# Patient Record
Sex: Male | Born: 1995 | Race: White | Hispanic: No | Marital: Single | Smoking: Never smoker
Health system: Southern US, Community
[De-identification: ages and names within clinical notes are randomized; demographics above are authoritative.]

## PROBLEM LIST (undated history)

## (undated) DIAGNOSIS — R1310 Dysphagia, unspecified: Secondary | ICD-10-CM

## (undated) DIAGNOSIS — R198 Other specified symptoms and signs involving the digestive system and abdomen: Secondary | ICD-10-CM

## (undated) DIAGNOSIS — Z8709 Personal history of other diseases of the respiratory system: Secondary | ICD-10-CM

## (undated) DIAGNOSIS — S62609A Fracture of unspecified phalanx of unspecified finger, initial encounter for closed fracture: Secondary | ICD-10-CM

## (undated) DIAGNOSIS — J45909 Unspecified asthma, uncomplicated: Secondary | ICD-10-CM

## (undated) DIAGNOSIS — Z8739 Personal history of other diseases of the musculoskeletal system and connective tissue: Secondary | ICD-10-CM

## (undated) HISTORY — PX: MANDIBLE FRACTURE SURGERY: SHX706

## (undated) HISTORY — PX: MEATOTOMY: SUR859

## (undated) HISTORY — PX: CYSTOSCOPY: SUR368

---
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
## 2015-02-01 ENCOUNTER — Emergency Department (HOSPITAL_COMMUNITY)
Admission: EM | Admit: 2015-02-01 | Discharge: 2015-02-02 | Disposition: A | Discharge status: Home / Self Care | Payer: BLUE CROSS/BLUE SHIELD | Attending: Emergency Medicine | Admitting: Emergency Medicine

## 2015-02-01 ENCOUNTER — Emergency Department (HOSPITAL_COMMUNITY): Payer: BLUE CROSS/BLUE SHIELD

## 2015-02-01 DIAGNOSIS — Z8739 Personal history of other diseases of the musculoskeletal system and connective tissue: Secondary | ICD-10-CM | POA: Diagnosis not present

## 2015-02-01 DIAGNOSIS — S6992XA Unspecified injury of left wrist, hand and finger(s), initial encounter: Secondary | ICD-10-CM | POA: Diagnosis present

## 2015-02-01 DIAGNOSIS — Y92310 Basketball court as the place of occurrence of the external cause: Secondary | ICD-10-CM | POA: Diagnosis not present

## 2015-02-01 DIAGNOSIS — Z791 Long term (current) use of non-steroidal anti-inflammatories (NSAID): Secondary | ICD-10-CM | POA: Insufficient documentation

## 2015-02-01 DIAGNOSIS — J45909 Unspecified asthma, uncomplicated: Secondary | ICD-10-CM | POA: Diagnosis not present

## 2015-02-01 DIAGNOSIS — S62623A Displaced fracture of medial phalanx of left middle finger, initial encounter for closed fracture: Secondary | ICD-10-CM | POA: Diagnosis not present

## 2015-02-01 DIAGNOSIS — Z79899 Other long term (current) drug therapy: Secondary | ICD-10-CM | POA: Insufficient documentation

## 2015-02-01 DIAGNOSIS — W500XXA Accidental hit or strike by another person, initial encounter: Secondary | ICD-10-CM | POA: Diagnosis not present

## 2015-02-01 DIAGNOSIS — Y998 Other external cause status: Secondary | ICD-10-CM | POA: Diagnosis not present

## 2015-02-01 DIAGNOSIS — S62609A Fracture of unspecified phalanx of unspecified finger, initial encounter for closed fracture: Secondary | ICD-10-CM

## 2015-02-01 DIAGNOSIS — Y9367 Activity, basketball: Secondary | ICD-10-CM | POA: Diagnosis not present

## 2015-02-01 DIAGNOSIS — S60032A Contusion of left middle finger without damage to nail, initial encounter: Secondary | ICD-10-CM | POA: Insufficient documentation

## 2015-02-01 NOTE — ED Provider Notes (Signed)
CSN: [id number]     Arrival date & time 02/01/15  2237 History   First MD Initiated Contact with Patient 02/01/15 2246     Chief Complaint  Patient presents with  . Finger Injury     (Consider location/radiation/quality/duration/timing/severity/associated sxs/prior Treatment) HPI   Howard Martin is a 19 y.o. male who presents to the Emergency Department complaining of left middle finger pain since yesterday.  He states that he was playing basketball when he accidentally struck his finger on another player.  Patient states he pulled his finger and splinted his finger but continued to have pain to his finger today.  He state that he played basketball again today and noticed increased bruising and swelling to the finger tonight.  Pain increased with movement of the finger.  He took tylenol this morning without relief.  He denies numbness, open wounds, wrist pain or pain to the other fingers.     Past Medical History  Diagnosis Date  . Bronchial asthma   . Scoliosis    Past Surgical History  Procedure Laterality Date  . Meatotomy    . Cystoscopy    . Mouth surgery    . Spinal fusion     Family History  Problem Relation Age of Onset  . Arthritis    . Cancer    . Asthma     Social History  Substance Use Topics  . Smoking status: Never Smoker   . Smokeless tobacco: Never Used  . Alcohol Use: No    Review of Systems  Constitutional: Negative for fever and chills.  Genitourinary: Negative for dysuria and difficulty urinating.  Musculoskeletal: Positive for joint swelling and arthralgias.  Skin: Negative for color change and wound.  All other systems reviewed and are negative.     Allergies  Cefprozil  Home Medications   Prior to Admission medications   Medication Sig Start Date End Date Taking? Authorizing Provider  albuterol (PROVENTIL HFA;VENTOLIN HFA) 108 (90 BASE) MCG/ACT inhaler Inhale 2 puffs into the lungs every 4 (four) hours as needed. For shortness of  breath     Historical Provider, MD  ibuprofen (ADVIL,MOTRIN) 800 MG tablet Take 1 tablet (800 mg total) by mouth 3 (three) times daily. 12/29/14   Eber Hong, MD   BP 117/74 mmHg  Pulse 102  Temp(Src) 98 F (36.7 C) (Oral)  Resp 16  Ht  (1.778 m)  Wt 70.308 kg  BMI 22.24 kg/m2  SpO2 99% Physical Exam  Constitutional: He is oriented to person, place, and time. He appears well-developed and well-nourished. No distress.  HENT:  Head: Normocephalic and atraumatic.  Cardiovascular: Normal rate, regular rhythm and normal heart sounds.   Pulmonary/Chest: Effort normal and breath sounds normal.  Musculoskeletal: He exhibits tenderness. He exhibits no edema.  ttp of the PIP and DIP joints of the left middle finger, mild to moderate edema and ecchymosis of the DIP, radial pulse is brisk, distal sensation intact.  CR< 2 sec.  No open wound or bony deformity.    Neurological: He is alert and oriented to person, place, and time. He exhibits normal muscle tone. Coordination normal.  Skin: Skin is warm and dry.  Nursing note and vitals reviewed.   ED Course  Procedures (including critical care time) Labs Review Labs Reviewed - No data to display  Imaging Review Dg Finger Middle Left  02/01/2015  CLINICAL DATA:  Left middle finger injury playing basketball yesterday. Pain. EXAM: LEFT MIDDLE FINGER 2+V COMPARISON:  None. FINDINGS:  Fracture subluxation of the middle phalanx with involvement of the articular surface at the proximal interphalangeal joint. Volar plate fragment measures 4 mm with dorsal subluxation of the remainder of the middle phalanx. Associated soft tissue edema. IMPRESSION: Volar plate fracture of middle phalanx with dorsal subluxation of the digit at the proximal interphalangeal joint. Electronically Signed   By: Rubye OaksMelanie  Ehinger M.D.   On: 02/01/2015 23:35   I have personally reviewed and evaluated these images and lab results as part of my medical  decision-making.    MDM   Final diagnoses:  Finger fracture, closed, initial encounter    XR results reviewed and discussed with patient.    Finger splinted by nursing staff, remains NV intact.  He agrees to close orthopedic f/u, RICE therapy.  Ibuprofen for pain.      Pauline Ausammy Courtlynn Holloman, PA-C 02/02/15 0000  Samuel JesterKathleen McManus, DO 02/03/15 0000

## 2015-02-01 NOTE — ED Notes (Signed)
I injured my left middle finger when I was playing basketball yesterday. Patient had a splint in place when he arrived.

## 2015-02-01 NOTE — Discharge Instructions (Signed)
Finger Fracture °Finger fractures are breaks in the bones of the fingers. There are many types of fractures. There are also different ways of treating these fractures. Your doctor will talk with you about the best way to treat your fracture. °Injury is the main cause of broken fingers. This includes: °· Injuries while playing sports. °· Workplace injuries. °· Falls. °HOME CARE °· Follow your doctor's instructions for: °¨ Activities. °¨ Exercises. °¨ Physical therapy. °· Take medicines only as told by your doctor for pain, discomfort, or fever. °GET HELP IF: °You have pain or swelling that limits: °· The motion of your fingers. °· The use of your fingers. °GET HELP RIGHT AWAY IF: °· You cannot feel your fingers, or your fingers become numb. °  °This information is not intended to replace advice given to you by your health care provider. Make sure you discuss any questions you have with your health care provider. °  °Document Released: 07/21/2007 Document Revised: 02/22/2014 Document Reviewed: 09/13/2012 °Elsevier Interactive Patient Education ©2016 [employer]. ° °

## 2015-02-02 NOTE — ED Notes (Signed)
Pt states understanding of care given and follow up instructions 

## 2015-02-11 ENCOUNTER — Ambulatory Visit: Payer: Self-pay | Admitting: Orthopedic Surgery

## 2015-02-11 ENCOUNTER — Telehealth: Payer: Self-pay | Admitting: Orthopedic Surgery

## 2015-02-11 NOTE — Telephone Encounter (Signed)
Call (voice message) received from patient/mom, following [facility] visit for problem of fractured finger, left hand, date of injury 02/01/15.  Returned call on our first day of return to clinic; reached voice mail; left message (ph [phone]) offering appointment.

## 2015-02-11 NOTE — Telephone Encounter (Signed)
Patient is scheduled to see Dr. Romeo AppleHarrison, Patient's mother is aware.

## 2015-02-13 ENCOUNTER — Ambulatory Visit (INDEPENDENT_AMBULATORY_CARE_PROVIDER_SITE_OTHER): Payer: BLUE CROSS/BLUE SHIELD | Admitting: Orthopedic Surgery

## 2015-02-13 VITALS — BP 120/78 | Ht 70.0 in | Wt 155.0 lb

## 2015-02-13 DIAGNOSIS — S62609A Fracture of unspecified phalanx of unspecified finger, initial encounter for closed fracture: Secondary | ICD-10-CM | POA: Diagnosis not present

## 2015-02-13 NOTE — Progress Notes (Signed)
New ER referral  Chief Complaint  Patient presents with  . Hand Injury    er follow up left long finger fracture, DOI 02/01/15    HPI Comments: Injured playing basket ball 12-16,  He felt a pop out of place it popped back in place. He then played basketball the next day and it did 2 more times and each time he reduced it. He went to the emergency room x-rays show fracture dislocation PIP joint  His complaints are PIP joint pain 12-13 days moderate in severity associated with loss of motion in the context of trauma pain not relieved by splinting.  Hand Injury     Review of Systems  Constitutional: Negative.   Neurological: Negative.    Past Medical History  Diagnosis Date  . Bronchial asthma   . Scoliosis    BP 120/78 mmHg  Ht 5\' 10"  (1.778 m)  Wt 155 lb (70.308 kg)  BMI 22.24 kg/m2 aaox 3  Mood normal  Normal appearance grooming hygiene. Gait normal. Skin normal. Sensation normal. Color of the digit normal.  Swollen PIP joint no flexion tender over the volar aspect stability tests without anesthesia and conclusive condition of flexor tendon motor function unreliable  All of his digits at the PIP joint level on both hands hyperextended  I reviewed his x-ray my independent interpretation is that he has a fracture dislocation at the PIP joint involving 50% of the joint surface there is some comminution there is well  Recommend follow-up with hand specialist  Encounter Diagnosis  Name Primary?  . Fracture dislocation of finger, closed, initial encounter Yes

## 2015-02-13 NOTE — Patient Instructions (Signed)
REFERRAL TO DR [facility] TUES JAN 3 AT 2 PM

## 2015-02-16 DIAGNOSIS — S62609A Fracture of unspecified phalanx of unspecified finger, initial encounter for closed fracture: Secondary | ICD-10-CM

## 2015-02-16 HISTORY — DX: Fracture of unspecified phalanx of unspecified finger, initial encounter for closed fracture: S62.609A

## 2015-02-19 ENCOUNTER — Other Ambulatory Visit: Payer: Self-pay | Admitting: Orthopedic Surgery

## 2015-02-20 ENCOUNTER — Encounter (HOSPITAL_BASED_OUTPATIENT_CLINIC_OR_DEPARTMENT_OTHER): Payer: Self-pay | Admitting: *Deleted

## 2015-02-20 NOTE — H&P (Signed)
Howard Martin is an 20 y.o. male.   Chief Complaint: h/o injury to L LF on 12-16. HPI: Was evaluated for first time in my office on 02-19-15, with persistent L LF PIP dorsal fx-dx, pain , stiffness, swelling.  Past Medical History  Diagnosis Date  . Difficulty swallowing pills   . History of kyphosis   . History of asthma     no inhaler use in years, per pt.  . Fracture dislocation of finger 02/2015    left long    Past Surgical History  Procedure Laterality Date  . Meatotomy    . Cystoscopy    . Spinal fusion  12/2012    thoracic/lumbar  . Tooth extraction  07/09/2003    removal impacted mesiodens  . Mandible fracture surgery      History reviewed. No pertinent family history. Social History:  reports that he has never smoked. He has never used smokeless tobacco. He reports that he does not drink alcohol or use illicit drugs.  Allergies:  Allergies  Allergen Reactions  . Cefprozil Other (See Comments)    UNKNOWN    No prescriptions prior to admission    No results found for this or any previous visit (from the past 48 hour(s)). No results found.  Review of Systems  All other systems reviewed and are negative.   Height 5\' 9"  (1.753 m), weight 73.483 kg (162 lb). Physical Exam  Constitutional: He is oriented to person, place, and time. He appears well-developed and well-nourished.  HENT:  Head: Normocephalic and atraumatic.  Eyes: EOM are normal.  Neck: Normal range of motion. Neck supple.  Cardiovascular: Intact distal pulses.   Respiratory: Effort normal.  GI: Soft.  Musculoskeletal:  L LF swollen, TTP at PIP.  Obvious dorsal dx at PIP.  No angular deformity.  Neurological: He is alert and oriented to person, place, and time. He has normal reflexes.  Skin: Skin is warm and dry.  Psychiatric: He has a normal mood and affect. His behavior is normal. Judgment and thought content normal.     Assessment/Plan 623-week old L LF PIP dorsal fx-dx  To OR for open  treament, likely with placement of a special force-couple splint as well.  G/R/O reviewed and consent obtained.  Tymeer Vaquera A. 02/20/2015, 1:37 PM

## 2015-02-21 ENCOUNTER — Ambulatory Visit (HOSPITAL_COMMUNITY): Payer: BLUE CROSS/BLUE SHIELD | Admitting: Anesthesiology

## 2015-02-21 ENCOUNTER — Encounter (HOSPITAL_BASED_OUTPATIENT_CLINIC_OR_DEPARTMENT_OTHER): Payer: Self-pay | Admitting: Anesthesiology

## 2015-02-21 ENCOUNTER — Ambulatory Visit (HOSPITAL_BASED_OUTPATIENT_CLINIC_OR_DEPARTMENT_OTHER)
Admission: RE | Admit: 2015-02-21 | Discharge: 2015-02-21 | Disposition: A | Discharge status: Home / Self Care | Payer: BLUE CROSS/BLUE SHIELD | Source: Ambulatory Visit | Attending: Orthopedic Surgery | Admitting: Orthopedic Surgery

## 2015-02-21 HISTORY — DX: Dysphagia, unspecified: R13.10

## 2015-02-21 HISTORY — DX: Personal history of other diseases of the respiratory system: Z87.09

## 2015-02-21 HISTORY — DX: Personal history of other diseases of the musculoskeletal system and connective tissue: Z87.39

## 2015-02-21 HISTORY — DX: Other specified symptoms and signs involving the digestive system and abdomen: R19.8

## 2015-02-21 HISTORY — DX: Fracture of unspecified phalanx of unspecified finger, initial encounter for closed fracture: S62.609A

## 2015-02-21 MED ORDER — PROPOFOL 10 MG/ML IV BOLUS
INTRAVENOUS | Status: AC
  Filled 2015-02-21: qty 20

## 2015-02-24 SURGERY — OPEN REDUCTION INTERNAL FIXATION (ORIF) DISTAL PHALANX
Anesthesia: General | Site: Finger | Laterality: Left

## 2015-02-24 MED ORDER — LACTATED RINGERS IV SOLN: INTRAVENOUS | Status: DC

## 2015-02-24 MED ORDER — MIDAZOLAM HCL 2 MG/2ML IJ SOLN: 1.0000 mg | INTRAMUSCULAR | Status: DC | PRN

## 2015-02-24 MED ORDER — FENTANYL CITRATE (PF) 100 MCG/2ML IJ SOLN: 50.0000 ug | INTRAMUSCULAR | Status: DC | PRN

## 2015-02-24 MED ORDER — SCOPOLAMINE 1 MG/3DAYS TD PT72: 1.0000 | MEDICATED_PATCH | Freq: Once | TRANSDERMAL | Status: DC | PRN

## 2015-02-24 MED ORDER — GLYCOPYRROLATE 0.2 MG/ML IJ SOLN: 0.2000 mg | Freq: Once | INTRAMUSCULAR | Status: DC | PRN

## 2015-02-24 NOTE — Progress Notes (Signed)
[facility]  Patient denies any change in medications or medical condition since that time.  I [facility] and go to Admitting to register.  Patient reported that his mother or step father will be driving him home and that he will have someone with him for the first 24 hours.

## 2015-02-25 ENCOUNTER — Encounter (HOSPITAL_BASED_OUTPATIENT_CLINIC_OR_DEPARTMENT_OTHER): Payer: Self-pay | Admitting: Anesthesiology

## 2015-02-25 ENCOUNTER — Encounter (HOSPITAL_COMMUNITY)
Admission: RE | Disposition: A | Discharge status: Home / Self Care | Payer: Self-pay | Source: Ambulatory Visit | Attending: Orthopedic Surgery

## 2015-02-25 ENCOUNTER — Ambulatory Visit (HOSPITAL_BASED_OUTPATIENT_CLINIC_OR_DEPARTMENT_OTHER): Payer: BLUE CROSS/BLUE SHIELD | Admitting: Anesthesiology

## 2015-02-25 ENCOUNTER — Ambulatory Visit (HOSPITAL_COMMUNITY): Payer: BLUE CROSS/BLUE SHIELD

## 2015-02-25 ENCOUNTER — Ambulatory Visit (HOSPITAL_BASED_OUTPATIENT_CLINIC_OR_DEPARTMENT_OTHER)
Admission: RE | Admit: 2015-02-25 | Discharge: 2015-02-25 | Disposition: A | Discharge status: Home / Self Care | Payer: BLUE CROSS/BLUE SHIELD | Source: Ambulatory Visit | Attending: Orthopedic Surgery | Admitting: Orthopedic Surgery

## 2015-02-25 ENCOUNTER — Ambulatory Visit (HOSPITAL_COMMUNITY)
Admission: RE | Admit: 2015-02-25 | Discharge: 2015-02-25 | Disposition: A | Discharge status: Home / Self Care | Payer: BLUE CROSS/BLUE SHIELD | Source: Ambulatory Visit | Attending: Orthopedic Surgery | Admitting: Orthopedic Surgery

## 2015-02-25 ENCOUNTER — Encounter (HOSPITAL_COMMUNITY): Payer: Self-pay | Admitting: *Deleted

## 2015-02-25 ENCOUNTER — Encounter (HOSPITAL_BASED_OUTPATIENT_CLINIC_OR_DEPARTMENT_OTHER)
Admission: RE | Disposition: A | Discharge status: Home / Self Care | Payer: Self-pay | Source: Ambulatory Visit | Attending: Orthopedic Surgery

## 2015-02-25 DIAGNOSIS — S62613A Displaced fracture of proximal phalanx of left middle finger, initial encounter for closed fracture: Secondary | ICD-10-CM | POA: Diagnosis present

## 2015-02-25 DIAGNOSIS — X58XXXA Exposure to other specified factors, initial encounter: Secondary | ICD-10-CM | POA: Insufficient documentation

## 2015-02-25 DIAGNOSIS — T148XXA Other injury of unspecified body region, initial encounter: Secondary | ICD-10-CM

## 2015-02-25 HISTORY — PX: OPEN REDUCTION INTERNAL FIXATION (ORIF) PROXIMAL PHALANX: SHX6235

## 2015-02-25 LAB — CBC
HEMATOCRIT: 42.9 % (ref 39.0–52.0)
Hemoglobin: 15.6 g/dL (ref 13.0–17.0)
MCH: 31.3 pg (ref 26.0–34.0)
MCHC: 36.4 g/dL — AB (ref 30.0–36.0)
MCV: 86.1 fL (ref 78.0–100.0)
Platelets: 205 10*3/uL (ref 150–400)
RBC: 4.98 MIL/uL (ref 4.22–5.81)
RDW: 12.3 % (ref 11.5–15.5)
WBC: 4 10*3/uL (ref 4.0–10.5)

## 2015-02-25 SURGERY — OPEN REDUCTION INTERNAL FIXATION (ORIF) PROXIMAL PHALANX
Anesthesia: General | Site: Finger | Laterality: Left

## 2015-02-25 SURGERY — OPEN REDUCTION INTERNAL FIXATION (ORIF) HAND
Anesthesia: General

## 2015-02-25 MED ORDER — HYDROMORPHONE HCL 1 MG/ML IJ SOLN
0.2500 mg | INTRAMUSCULAR | Status: DC | PRN
  Administered 2015-02-25: 0.5 mg via INTRAVENOUS

## 2015-02-25 MED ORDER — DEXAMETHASONE SODIUM PHOSPHATE 10 MG/ML IJ SOLN
INTRAMUSCULAR | Status: AC
  Filled 2015-02-25: qty 1

## 2015-02-25 MED ORDER — FENTANYL CITRATE (PF) 100 MCG/2ML IJ SOLN
INTRAMUSCULAR | Status: AC
  Filled 2015-02-25: qty 2

## 2015-02-25 MED ORDER — CLINDAMYCIN PHOSPHATE 900 MG/50ML IV SOLN
900.0000 mg | INTRAVENOUS | Status: DC
  Filled 2015-02-25: qty 50

## 2015-02-25 MED ORDER — OXYCODONE HCL 5 MG/5ML PO SOLN: 5.0000 mg | Freq: Once | ORAL | Status: DC | PRN

## 2015-02-25 MED ORDER — ONDANSETRON HCL 4 MG/2ML IJ SOLN
INTRAMUSCULAR | Status: AC
  Filled 2015-02-25: qty 2

## 2015-02-25 MED ORDER — OXYCODONE-ACETAMINOPHEN 5-325 MG PO TABS: 1.0000 | ORAL_TABLET | ORAL | Status: DC | PRN

## 2015-02-25 MED ORDER — CLINDAMYCIN PHOSPHATE 900 MG/50ML IV SOLN
INTRAVENOUS | Status: AC
  Filled 2015-02-25: qty 50

## 2015-02-25 MED ORDER — BUPIVACAINE-EPINEPHRINE (PF) 0.5% -1:200000 IJ SOLN
INTRAMUSCULAR | Status: DC | PRN
  Administered 2015-02-25: 9 mL

## 2015-02-25 MED ORDER — SCOPOLAMINE 1 MG/3DAYS TD PT72: 1.0000 | MEDICATED_PATCH | Freq: Once | TRANSDERMAL | Status: DC | PRN

## 2015-02-25 MED ORDER — GLYCOPYRROLATE 0.2 MG/ML IJ SOLN: 0.2000 mg | Freq: Once | INTRAMUSCULAR | Status: DC | PRN

## 2015-02-25 MED ORDER — 0.9 % SODIUM CHLORIDE (POUR BTL) OPTIME
TOPICAL | Status: DC | PRN
  Administered 2015-02-25: 300 mL

## 2015-02-25 MED ORDER — LIDOCAINE HCL (CARDIAC) 20 MG/ML IV SOLN
INTRAVENOUS | Status: DC | PRN
  Administered 2015-02-25: 60 mg via INTRAVENOUS

## 2015-02-25 MED ORDER — HYDROCODONE-ACETAMINOPHEN 7.5-325 MG/15ML PO SOLN: 10.0000 mL | Freq: Four times a day (QID) | ORAL | Status: DC | PRN

## 2015-02-25 MED ORDER — CLINDAMYCIN PHOSPHATE 600 MG/50ML IV SOLN
INTRAVENOUS | Status: AC
  Filled 2015-02-25: qty 50

## 2015-02-25 MED ORDER — CLINDAMYCIN PHOSPHATE 600 MG/50ML IV SOLN
INTRAVENOUS | Status: DC | PRN
  Administered 2015-02-25: 600 mg via INTRAVENOUS

## 2015-02-25 MED ORDER — PROPOFOL 10 MG/ML IV BOLUS
INTRAVENOUS | Status: DC | PRN
  Administered 2015-02-25 (×2): 50 mg via INTRAVENOUS
  Administered 2015-02-25: 150 mg via INTRAVENOUS

## 2015-02-25 MED ORDER — MIDAZOLAM HCL 5 MG/5ML IJ SOLN
INTRAMUSCULAR | Status: DC | PRN
  Administered 2015-02-25: 2 mg via INTRAVENOUS

## 2015-02-25 MED ORDER — MIDAZOLAM HCL 2 MG/2ML IJ SOLN
INTRAMUSCULAR | Status: AC
Start: 2015-02-25 — End: 2015-02-25
  Filled 2015-02-25: qty 2

## 2015-02-25 MED ORDER — HYDROMORPHONE HCL 1 MG/ML IJ SOLN
INTRAMUSCULAR | Status: AC
  Filled 2015-02-25: qty 1

## 2015-02-25 MED ORDER — ACETAMINOPHEN 325 MG PO TABS: 325.0000 mg | ORAL_TABLET | ORAL | Status: DC | PRN

## 2015-02-25 MED ORDER — ACETAMINOPHEN 160 MG/5ML PO SOLN: 325.0000 mg | ORAL | Status: DC | PRN

## 2015-02-25 MED ORDER — LACTATED RINGERS IV SOLN
INTRAVENOUS | Status: DC
  Administered 2015-02-25: 14:00:00 via INTRAVENOUS

## 2015-02-25 MED ORDER — FENTANYL CITRATE (PF) 100 MCG/2ML IJ SOLN
INTRAMUSCULAR | Status: DC | PRN
  Administered 2015-02-25: 100 ug via INTRAVENOUS
  Administered 2015-02-25: 25 ug via INTRAVENOUS
  Administered 2015-02-25: 50 ug via INTRAVENOUS
  Administered 2015-02-25: 25 ug via INTRAVENOUS

## 2015-02-25 MED ORDER — LACTATED RINGERS IV SOLN
INTRAVENOUS | Status: DC
  Administered 2015-02-25 (×2): via INTRAVENOUS

## 2015-02-25 MED ORDER — ONDANSETRON HCL 4 MG/2ML IJ SOLN
INTRAMUSCULAR | Status: DC | PRN
  Administered 2015-02-25: 4 mg via INTRAVENOUS

## 2015-02-25 MED ORDER — DEXAMETHASONE SODIUM PHOSPHATE 4 MG/ML IJ SOLN
INTRAMUSCULAR | Status: DC | PRN
  Administered 2015-02-25: 10 mg via INTRAVENOUS

## 2015-02-25 MED ORDER — OXYCODONE HCL 5 MG PO TABS: 5.0000 mg | ORAL_TABLET | Freq: Once | ORAL | Status: DC | PRN

## 2015-02-25 SURGICAL SUPPLY — 38 items
BANDAGE COBAN STERILE 2 (GAUZE/BANDAGES/DRESSINGS) IMPLANT
BNDG CMPR 9X4 STRL LF SNTH (GAUZE/BANDAGES/DRESSINGS)
BNDG COHESIVE 1X5 TAN STRL LF (GAUZE/BANDAGES/DRESSINGS) IMPLANT
BNDG COHESIVE 4X5 TAN STRL (GAUZE/BANDAGES/DRESSINGS) IMPLANT
BNDG CONFORM 2 STRL LF (GAUZE/BANDAGES/DRESSINGS) ×4 IMPLANT
BNDG ESMARK 4X9 LF (GAUZE/BANDAGES/DRESSINGS) IMPLANT
BNDG GAUZE ELAST 4 BULKY (GAUZE/BANDAGES/DRESSINGS) IMPLANT
CHLORAPREP W/TINT 26ML (MISCELLANEOUS) ×4 IMPLANT
CLOSURE WOUND 1/2 X4 (GAUZE/BANDAGES/DRESSINGS) ×1
CORDS BIPOLAR (ELECTRODE) IMPLANT
CUFF TOURNIQUET SINGLE 18IN (TOURNIQUET CUFF) IMPLANT
DEPRESSOR TONGUE BLADE STERILE (MISCELLANEOUS) ×4 IMPLANT
DRAPE C-ARM 42X72 X-RAY (DRAPES) ×4 IMPLANT
DRAPE SURG 17X23 STRL (DRAPES) ×4 IMPLANT
DRSG EMULSION OIL 3X3 NADH (GAUZE/BANDAGES/DRESSINGS) ×4 IMPLANT
GAUZE SPONGE 4X4 12PLY STRL (GAUZE/BANDAGES/DRESSINGS) ×4 IMPLANT
GLOVE BIO SURGEON STRL SZ 6.5 (GLOVE) ×3 IMPLANT
GLOVE BIO SURGEON STRL SZ7.5 (GLOVE) ×4 IMPLANT
GLOVE BIO SURGEONS STRL SZ 6.5 (GLOVE) ×1
GLOVE BIOGEL PI IND STRL 7.0 (GLOVE) ×2 IMPLANT
GLOVE BIOGEL PI IND STRL 8 (GLOVE) ×2 IMPLANT
GLOVE BIOGEL PI INDICATOR 7.0 (GLOVE) ×2
GLOVE BIOGEL PI INDICATOR 8 (GLOVE) ×2
GOWN STRL REUS W/ TWL XL LVL3 (GOWN DISPOSABLE) ×2 IMPLANT
GOWN STRL REUS W/TWL XL LVL3 (GOWN DISPOSABLE) ×3
KIT BASIN OR (CUSTOM PROCEDURE TRAY) ×4 IMPLANT
NEEDLE HYPO 22GX1.5 SAFETY (NEEDLE) IMPLANT
NS IRRIG 1000ML POUR BTL (IV SOLUTION) ×4 IMPLANT
PACK ORTHO EXTREMITY (CUSTOM PROCEDURE TRAY) ×4 IMPLANT
STRIP CLOSURE SKIN 1/2X4 (GAUZE/BANDAGES/DRESSINGS) ×3 IMPLANT
SUT CHROMIC 6 0 PS 4 (SUTURE) IMPLANT
SUT ETHILON 4 0 PS 2 18 (SUTURE) ×4 IMPLANT
SUT VICRYL RAPIDE 4/0 PS 2 (SUTURE) IMPLANT
SYR BULB 3OZ (MISCELLANEOUS) IMPLANT
SYRINGE 10CC LL (SYRINGE) IMPLANT
TOWEL OR 17X24 6PK STRL BLUE (TOWEL DISPOSABLE) ×4 IMPLANT
TOWEL OR NON WOVEN STRL DISP B (DISPOSABLE) IMPLANT
UNDERPAD 30X30 INCONTINENT (UNDERPADS AND DIAPERS) ×4 IMPLANT

## 2015-02-25 SURGICAL SUPPLY — 39 items
ANCH SUT 2-0 MN NDL DRL PLSTR (Anchor) ×2 IMPLANT
ANCHOR JUGGERKNOT 1.0 1DR 2-0 (Anchor) ×3 IMPLANT
BANDAGE COBAN STERILE 2 (GAUZE/BANDAGES/DRESSINGS) ×3 IMPLANT
BLADE SURG 15 STRL LF DISP TIS (BLADE) ×1 IMPLANT
BLADE SURG 15 STRL SS (BLADE) ×4
BNDG CMPR 9X4 STRL LF SNTH (GAUZE/BANDAGES/DRESSINGS) ×2
BNDG COHESIVE 4X5 TAN STRL (GAUZE/BANDAGES/DRESSINGS) ×3 IMPLANT
BNDG ESMARK 4X9 LF (GAUZE/BANDAGES/DRESSINGS) ×3 IMPLANT
BNDG GAUZE ELAST 4 BULKY (GAUZE/BANDAGES/DRESSINGS) ×3 IMPLANT
CHLORAPREP W/TINT 26ML (MISCELLANEOUS) ×3 IMPLANT
COVER BACK TABLE 60X90IN (DRAPES) ×3 IMPLANT
COVER MAYO STAND STRL (DRAPES) ×3 IMPLANT
CUFF TOURNIQUET SINGLE 18IN (TOURNIQUET CUFF) ×3 IMPLANT
DRAPE C-ARM 42X72 X-RAY (DRAPES) ×3 IMPLANT
DRAPE EXTREMITY T 121X128X90 (DRAPE) ×3 IMPLANT
DRAPE SURG 17X23 STRL (DRAPES) ×3 IMPLANT
DRSG EMULSION OIL 3X3 NADH (GAUZE/BANDAGES/DRESSINGS) ×3 IMPLANT
FIXATOR TURNKEY FCS (Orthopedic Implant) ×3 IMPLANT
GAUZE SPONGE 4X4 12PLY STRL (GAUZE/BANDAGES/DRESSINGS) ×3 IMPLANT
GLOVE BIO SURGEON STRL SZ7.5 (GLOVE) ×3 IMPLANT
GLOVE BIOGEL PI IND STRL 7.0 (GLOVE) ×2 IMPLANT
GLOVE BIOGEL PI IND STRL 8 (GLOVE) ×1 IMPLANT
GLOVE BIOGEL PI INDICATOR 7.0 (GLOVE) ×4
GLOVE BIOGEL PI INDICATOR 8 (GLOVE) ×2
GLOVE ECLIPSE 6.5 STRL STRAW (GLOVE) ×6 IMPLANT
GOWN STRL REUS W/ TWL LRG LVL3 (GOWN DISPOSABLE) ×2 IMPLANT
GOWN STRL REUS W/TWL LRG LVL3 (GOWN DISPOSABLE) ×8
GOWN STRL REUS W/TWL XL LVL3 (GOWN DISPOSABLE) ×3 IMPLANT
K-WIRE .045X4 (WIRE) ×3 IMPLANT
NS IRRIG 1000ML POUR BTL (IV SOLUTION) ×3 IMPLANT
PACK BASIN DAY SURGERY FS (CUSTOM PROCEDURE TRAY) ×3 IMPLANT
PADDING CAST ABS 4INX4YD NS (CAST SUPPLIES) ×2
PADDING CAST ABS COTTON 4X4 ST (CAST SUPPLIES) ×1 IMPLANT
STOCKINETTE 6  STRL (DRAPES) ×2
STOCKINETTE 6 STRL (DRAPES) ×1 IMPLANT
SUT VICRYL RAPIDE 4/0 PS 2 (SUTURE) ×3 IMPLANT
SYR BULB 3OZ (MISCELLANEOUS) ×3 IMPLANT
TOWEL OR 17X24 6PK STRL BLUE (TOWEL DISPOSABLE) ×3 IMPLANT
UNDERPAD 30X30 (UNDERPADS AND DIAPERS) ×3 IMPLANT

## 2015-02-25 NOTE — Op Note (Signed)
02/25/2015  3:26 PM  PATIENT:  Howard Martin  20 y.o. male  PRE-OPERATIVE DIAGNOSIS:  L LF PIP fracture-dislocation  POST-OPERATIVE DIAGNOSIS:  Same  PROCEDURE:  Open treatment of left long finger PIP fracture dislocation, with application of external fixator  SURGEON: Cliffton Asters. Janee Morn, MD  PHYSICIAN ASSISTANT: Danielle Rankin, OPA-C  ANESTHESIA:  general  SPECIMENS:  None  DRAINS:   None  EBL:  less than 50 mL  PREOPERATIVE INDICATIONS:  [name] is a  20 y.o. male with a L LF PIP fx-dx that occurred on 01-31-15 playing ball.  The risks benefits and alternatives were discussed with the patient preoperatively including but not limited to the risks of infection, bleeding, nerve injury, cardiopulmonary complications, the need for revision surgery, among others, and the patient verbalized understanding and consented to proceed.  OPERATIVE IMPLANTS: TurnKey FCS and mini-juggerknot anchor  OPERATIVE PROCEDURE:  After receiving prophylactic antibiotics, the patient was escorted to the operative theatre and placed in a supine position.  General anesthesia was administered. A surgical "time-out" was performed during which the planned procedure, proposed operative site, and the correct patient identity were compared to the operative consent and agreement confirmed by the circulating nurse according to current facility policy.  Following application of a tourniquet to the operative extremity, the exposed skin was prepped with Chloraprep and draped in the usual sterile fashion.  The limb was exsanguinated with an Esmarch bandage and the tourniquet inflated to approximately higher than systolic BP.  The joint could not be passively reduced, so a 2 limb zigzag incision was made over the PIP joint volarly. Skin flaps were retracted full-thickness radially and ulnarly. The filmy sheath between the A2 and A4 pulleys was opened and the tendons pulled aside. A freer was worked into the  fracture site, and the volar plate was incised longitudinally proximally from the fracture so that a flap of volar plate was created, proximally based, with a bony fragment distally. The bony fragment was comminuted into a couple of pieces, but reasonably well contained. The joint surface was inspected, and the cartilage still in reasonably good condition without major scuffing or divots. Using a freer elevator, the dislocation was then reduced. The Turnkey FCS splint was then applied in standard fashion, and it held the joint nicely reduced. A mini juggernaut suture anchor was then placed into the raw surface at the base of the middle phalanx, and brought the 2 different drill holes in the volar lip fragment, which was reduced and then the suture secured to help secure that reduction. The 2 suture ends were then used to repair the medial and lateral longitudinal division of the volar plate proximally. Final images were obtained revealing satisfactory reduction alignment of the fracture fragment and the joint. The joint seemed to move smoothly through a range of motion, with good cartilage the cartilage contact throughout the range. Half percent Marcaine with epinephrine was instilled as a digital block at the base of the digit to help with postoperative pain. Tourniquet was released some additional hemostasis obtained and the skin was closed with 4-0 Vicryl Rapide interrupted sutures. A bulky hand dressing was applied and he was awakened and taken to recovery room in stable condition, breathing spontaneously.    DISPOSITION: He'll be discharged home today with instructions for gentle active range of motion exercises. He will return to the office in approximately a week, at which time we will remove the bandages, obtain x-rays of the left long finger, and  teach him how to care for the fixator, the pin sites, etc. We can also determine whether formal therapy would be helpful or not.

## 2015-02-25 NOTE — Discharge Instructions (Signed)
Discharge Instructions ° ° °You have a dressing with a plaster splint incorporated in it. °Move your fingers as much as possible, making a full fist and fully opening the fist. °Elevate your hand to reduce pain & swelling of the digits.  Ice over the operative site may be helpful to reduce pain & swelling.  DO NOT USE HEAT. °Pain medicine has been prescribed for you.  °Use your medicine as needed over the first 48 hours, and then you can begin to taper your use.  You may use Tylenol in place of your prescribed pain medication, but not IN ADDITION to it. °Leave the dressing in place until you return to our office.  °You may shower, but keep the bandage clean & dry.  °You may drive a car when you are off of prescription pain medications and can safely control your vehicle with both hands. °Our office will call you to arrange follow-up ° ° °Please call [phone] during normal business hours or [phone] after hours for any problems. Including the following: ° °- excessive redness of the incisions °- drainage for more than 4 days °- fever of more than 101.5 F ° °*Please note that pain medications will not be refilled after hours or on weekends. ° °

## 2015-02-25 NOTE — Anesthesia Procedure Notes (Signed)
Procedure Name: LMA Insertion Date/Time: 02/25/2015 3:50 PM Performed by: Gar GibbonKEETON, Yuridiana Formanek S Pre-anesthesia Checklist: Patient identified, Emergency Drugs available, Suction available and Patient being monitored Patient Re-evaluated:Patient Re-evaluated prior to inductionOxygen Delivery Method: Circle System Utilized Preoxygenation: Pre-oxygenation with 100% oxygen Intubation Type: IV induction Ventilation: Mask ventilation without difficulty LMA: LMA inserted LMA Size: 5.0 Number of attempts: 1 Airway Equipment and Method: Bite block Placement Confirmation: positive ETCO2 Tube secured with: Tape Dental Injury: Teeth and Oropharynx as per pre-operative assessment

## 2015-02-25 NOTE — Transfer of Care (Signed)
Immediate Anesthesia Transfer of Care Note  Patient: Howard Martin  Procedure(s) Performed: Procedure(s) with comments: OPEN REDUCTION INTERNAL FIXATION (ORIF) LEFT LONG FINGER (Left) - Left long.  Patient Location: PACU  Anesthesia Type:General  Level of Consciousness: sedated, lethargic and responds to stimulation  Airway & Oxygen Therapy: Patient Spontanous Breathing and Patient connected to face mask oxygen  Post-op Assessment: Report given to RN and Post -op Vital signs reviewed and stable  Post vital signs: Reviewed and stable  Last Vitals:  Filed Vitals:   02/25/15 1533  BP: 131/82  Pulse: 86  Temp: 36.9 C  Resp: 18    Complications: No apparent anesthesia complications

## 2015-02-25 NOTE — Interval H&P Note (Signed)
History and Physical Interval Note:  02/25/2015 3:26 PM  Howard Martin  has presented today for surgery, with the diagnosis of left long finger fracture/dislocation  The various methods of treatment have been discussed with the patient and family. After consideration of risks, benefits and other options for treatment, the patient has consented to  Procedure(s): OPEN REDUCTION INTERNAL FIXATION (ORIF) LEFT LONG FINGER (Left) as a surgical intervention .  The patient's history has been reviewed, patient examined, no change in status, stable for surgery.  I have reviewed the patient's chart and labs.  Questions were answered to the patient's satisfaction.     Fritzi Scripter A.

## 2015-02-25 NOTE — Anesthesia Preprocedure Evaluation (Signed)
Anesthesia Evaluation  Patient identified by MRN, date of birth, ID band Patient awake    Reviewed: Allergy & Precautions, NPO status , Patient's Chart, lab work & pertinent test results  History of Anesthesia Complications Negative for: history of anesthetic complications  Airway Mallampati: I  TM Distance: >3 FB Neck ROM: Full    Dental  (+) Teeth Intact   Pulmonary asthma ,    breath sounds clear to auscultation       Cardiovascular negative cardio ROS   Rhythm:Regular     Neuro/Psych negative neurological ROS  negative psych ROS   GI/Hepatic negative GI ROS, Neg liver ROS,   Endo/Other  negative endocrine ROS  Renal/GU negative Renal ROS     Musculoskeletal   Abdominal   Peds  Hematology negative hematology ROS (+)   Anesthesia Other Findings   Reproductive/Obstetrics                             Anesthesia Physical Anesthesia Plan  ASA: II  Anesthesia Plan: General   Post-op Pain Management:    Induction: Intravenous  Airway Management Planned: LMA  Additional Equipment: None  Intra-op Plan:   Post-operative Plan: Extubation in OR  Informed Consent: I have reviewed the patients History and Physical, chart, labs and discussed the procedure including the risks, benefits and alternatives for the proposed anesthesia with the patient or authorized representative who has indicated his/her understanding and acceptance.   Dental advisory given  Plan Discussed with: CRNA and Surgeon  Anesthesia Plan Comments:         Anesthesia Quick Evaluation

## 2015-02-25 NOTE — Discharge Instructions (Addendum)
Discharge Instructions   You have a light dressing on your hand.  You may begin gentle motion of your fingers and hand immediately, but you should not do any heavy lifting or gripping.  Elevate your hand to reduce pain & swelling of the digits.  Ice over the operative site may be helpful to reduce pain & swelling.  DO NOT USE HEAT. Pain medicine has been prescribed for you.  Use your medicine as needed over the first 48 hours, and then you can begin to taper your use. You may use Tylenol in place of your prescribed pain medication, but not IN ADDITION to it. Leave the dressing in place until you return to the office. You may drive a car when you are off of prescription pain medications and can safely control your vehicle with both hands. We will address whether therapy will be required or not when you return to the office. Our office will call you this week to make an appointment for early next week.   Please call [phone] during normal business hours or [phone] after hours for any problems. Including the following:  - excessive redness of the incisions - drainage for more than 4 days - fever of more than 101.5 F  *Please note that pain medications will not be refilled after hours or on weekends.\     Post Anesthesia Home Care Instructions  Activity: Get plenty of rest for the remainder of the day. A responsible adult should stay with you for 24 hours following the procedure.  For the next 24 hours, DO NOT: -Drive a car -Advertising copywriterperate machinery -Drink alcoholic beverages -Take any medication unless instructed by your physician -Make any legal decisions or sign important papers.  Meals: Start with liquid foods such as gelatin or soup. Progress to regular foods as tolerated. Avoid greasy, spicy, heavy foods. If nausea and/or vomiting occur, drink only clear liquids until the nausea and/or vomiting subsides. Call your physician if vomiting continues.  Special  Instructions/Symptoms: Your throat may feel dry or sore from the anesthesia or the breathing tube placed in your throat during surgery. If this causes discomfort, gargle with warm salt water. The discomfort should disappear within 24 hours.  If you had a scopolamine patch placed behind your ear for the management of post- operative nausea and/or vomiting:  1. The medication in the patch is effective for 72 hours, after which it should be removed.  Wrap patch in a tissue and discard in the trash. Wash hands thoroughly with soap and water. 2. You may remove the patch earlier than 72 hours if you experience unpleasant side effects which may include dry mouth, dizziness or visual disturbances. 3. Avoid touching the patch. Wash your hands with soap and water after contact with the patch.

## 2015-02-25 NOTE — Anesthesia Preprocedure Evaluation (Deleted)
Anesthesia Evaluation  Patient identified by MRN, date of birth, ID band Patient awake    Reviewed: Allergy & Precautions, NPO status , Patient's Chart, lab work & pertinent test results  Airway Mallampati: I  TM Distance: >3 FB Neck ROM: Full    Dental  (+) Dental Advisory Given   Pulmonary neg pulmonary ROS,    breath sounds clear to auscultation       Cardiovascular negative cardio ROS   Rhythm:Regular Rate:Normal     Neuro/Psych Back rods for scoliosis    GI/Hepatic negative GI ROS, Neg liver ROS,   Endo/Other  negative endocrine ROS  Renal/GU negative Renal ROS     Musculoskeletal negative musculoskeletal ROS (+)   Abdominal   Peds  Hematology negative hematology ROS (+)   Anesthesia Other Findings   Reproductive/Obstetrics                              Lab Results  Component Value Date   WBC 4.0 02/25/2015   HGB 15.6 02/25/2015   HCT 42.9 02/25/2015   MCV 86.1 02/25/2015   PLT 205 02/25/2015   No results found for: CREATININE, BUN, NA, K, CL, CO2  Anesthesia Physical Anesthesia Plan  ASA: I  Anesthesia Plan: General   Post-op Pain Management:    Induction: Intravenous  Airway Management Planned: LMA  Additional Equipment:   Intra-op Plan:   Post-operative Plan: Extubation in OR  Informed Consent: I have reviewed the patients History and Physical, chart, labs and discussed the procedure including the risks, benefits and alternatives for the proposed anesthesia with the patient or authorized representative who has indicated his/her understanding and acceptance.   Dental advisory given  Plan Discussed with: CRNA  Anesthesia Plan Comments:         Anesthesia Quick Evaluation

## 2015-02-25 NOTE — H&P (View-Only) (Signed)
Howard Martin is an 20 y.o. male.   Chief Complaint: h/o injury to L LF on 12-16. HPI: Was evaluated for first time in my office on 02-19-15, with persistent L LF PIP dorsal fx-dx, pain , stiffness, swelling.  Past Medical History  Diagnosis Date  . Difficulty swallowing pills   . History of kyphosis   . History of asthma     no inhaler use in years, per pt.  . Fracture dislocation of finger 02/2015    left long    Past Surgical History  Procedure Laterality Date  . Meatotomy    . Cystoscopy    . Spinal fusion  12/2012    thoracic/lumbar  . Tooth extraction  07/09/2003    removal impacted mesiodens  . Mandible fracture surgery      History reviewed. No pertinent family history. Social History:  reports that he has never smoked. He has never used smokeless tobacco. He reports that he does not drink alcohol or use illicit drugs.  Allergies:  Allergies  Allergen Reactions  . Cefprozil Other (See Comments)    UNKNOWN    No prescriptions prior to admission    No results found for this or any previous visit (from the past 48 hour(s)). No results found.  Review of Systems  All other systems reviewed and are negative.   Height 5\' 9"  (1.753 m), weight 73.483 kg (162 lb). Physical Exam  Constitutional: He is oriented to person, place, and time. He appears well-developed and well-nourished.  HENT:  Head: Normocephalic and atraumatic.  Eyes: EOM are normal.  Neck: Normal range of motion. Neck supple.  Cardiovascular: Intact distal pulses.   Respiratory: Effort normal.  GI: Soft.  Musculoskeletal:  L LF swollen, TTP at PIP.  Obvious dorsal dx at PIP.  No angular deformity.  Neurological: He is alert and oriented to person, place, and time. He has normal reflexes.  Skin: Skin is warm and dry.  Psychiatric: He has a normal mood and affect. His behavior is normal. Judgment and thought content normal.     Assessment/Plan 453-week old L LF PIP dorsal fx-dx  To OR for open  treament, likely with placement of a special force-couple splint as well.  G/R/O reviewed and consent obtained.  Howard Martin A. 02/20/2015, 1:37 PM

## 2015-02-26 ENCOUNTER — Encounter (HOSPITAL_BASED_OUTPATIENT_CLINIC_OR_DEPARTMENT_OTHER): Payer: Self-pay | Admitting: Orthopedic Surgery

## 2015-02-27 NOTE — Anesthesia Postprocedure Evaluation (Signed)
Anesthesia Post Note  Patient: Howard DubonnetBryson D Martin  Procedure(s) Performed: Procedure(s) (LRB): OPEN REDUCTION INTERNAL FIXATION (ORIF) LEFT LONG FINGER (Left)  Patient location during evaluation: PACU Anesthesia Type: General Level of consciousness: awake Pain management: pain level controlled Vital Signs Assessment: post-procedure vital signs reviewed and stable Respiratory status: spontaneous breathing Cardiovascular status: stable Postop Assessment: no signs of nausea or vomiting Anesthetic complications: no    Last Vitals:  Filed Vitals:   02/25/15 1800 02/25/15 1815  BP: 142/86 137/83  Pulse: 98 88  Temp:  36.4 C  Resp: 16 16    Last Pain:  Filed Vitals:   02/26/15 0934  PainSc: 1                  [name]

## 2016-05-12 ENCOUNTER — Encounter (HOSPITAL_COMMUNITY): Payer: Self-pay | Admitting: *Deleted

## 2016-05-12 ENCOUNTER — Emergency Department (HOSPITAL_COMMUNITY): Payer: BLUE CROSS/BLUE SHIELD

## 2016-05-12 ENCOUNTER — Emergency Department (HOSPITAL_COMMUNITY)
Admission: EM | Admit: 2016-05-12 | Discharge: 2016-05-12 | Disposition: A | Discharge status: Home / Self Care | Payer: BLUE CROSS/BLUE SHIELD | Attending: Emergency Medicine | Admitting: Emergency Medicine

## 2016-05-12 DIAGNOSIS — J45909 Unspecified asthma, uncomplicated: Secondary | ICD-10-CM | POA: Insufficient documentation

## 2016-05-12 DIAGNOSIS — R1032 Left lower quadrant pain: Secondary | ICD-10-CM | POA: Diagnosis present

## 2016-05-12 DIAGNOSIS — I880 Nonspecific mesenteric lymphadenitis: Secondary | ICD-10-CM | POA: Diagnosis not present

## 2016-05-12 HISTORY — DX: Unspecified asthma, uncomplicated: J45.909

## 2016-05-12 LAB — CBC
HCT: 41.6 % (ref 39.0–52.0)
Hemoglobin: 15.2 g/dL (ref 13.0–17.0)
MCH: 31.5 pg (ref 26.0–34.0)
MCHC: 36.5 g/dL — ABNORMAL HIGH (ref 30.0–36.0)
MCV: 86.3 fL (ref 78.0–100.0)
Platelets: 174 10*3/uL (ref 150–400)
RBC: 4.82 MIL/uL (ref 4.22–5.81)
RDW: 12.7 % (ref 11.5–15.5)
WBC: 4.7 10*3/uL (ref 4.0–10.5)

## 2016-05-12 LAB — URINALYSIS, ROUTINE W REFLEX MICROSCOPIC
Bilirubin Urine: NEGATIVE
Glucose, UA: NEGATIVE mg/dL
Hgb urine dipstick: NEGATIVE
Ketones, ur: NEGATIVE mg/dL
Leukocytes, UA: NEGATIVE
Nitrite: NEGATIVE
Protein, ur: NEGATIVE mg/dL
Specific Gravity, Urine: 1.028 (ref 1.005–1.030)
pH: 5 (ref 5.0–8.0)

## 2016-05-12 LAB — COMPREHENSIVE METABOLIC PANEL
ALT: 12 U/L — ABNORMAL LOW (ref 17–63)
AST: 19 U/L (ref 15–41)
Albumin: 4.3 g/dL (ref 3.5–5.0)
Alkaline Phosphatase: 62 U/L (ref 38–126)
Anion gap: 6 (ref 5–15)
BUN: 13 mg/dL (ref 6–20)
CO2: 24 mmol/L (ref 22–32)
Calcium: 8.7 mg/dL — ABNORMAL LOW (ref 8.9–10.3)
Chloride: 99 mmol/L — ABNORMAL LOW (ref 101–111)
Creatinine, Ser: 0.86 mg/dL (ref 0.61–1.24)
GFR calc Af Amer: >60 mL/min (ref >60)
GFR calc non Af Amer: >60 mL/min (ref >60)
Glucose, Bld: 104 mg/dL — ABNORMAL HIGH (ref 65–99)
Potassium: 3.4 mmol/L — ABNORMAL LOW (ref 3.5–5.1)
Sodium: 129 mmol/L — ABNORMAL LOW (ref 135–145)
Total Bilirubin: 2.3 mg/dL — ABNORMAL HIGH (ref 0.3–1.2)
Total Protein: 7.2 g/dL (ref 6.5–8.1)

## 2016-05-12 LAB — LIPASE, BLOOD: Lipase: 21 U/L (ref 11–51)

## 2016-05-12 MED ORDER — IOPAMIDOL (ISOVUE-300) INJECTION 61%
INTRAVENOUS | Status: AC
  Administered 2016-05-12: 30 mL via ORAL
  Filled 2016-05-12: qty 30

## 2016-05-12 MED ORDER — MORPHINE SULFATE (PF) 4 MG/ML IV SOLN
4.0000 mg | Freq: Once | INTRAVENOUS | Status: AC
  Administered 2016-05-12: 4 mg via INTRAVENOUS
  Filled 2016-05-12: qty 1

## 2016-05-12 MED ORDER — KETOROLAC TROMETHAMINE 30 MG/ML IJ SOLN
15.0000 mg | Freq: Once | INTRAMUSCULAR | Status: AC
  Administered 2016-05-12: 15 mg via INTRAVENOUS
  Filled 2016-05-12: qty 1

## 2016-05-12 MED ORDER — IOPAMIDOL (ISOVUE-300) INJECTION 61%
100.0000 mL | Freq: Once | INTRAVENOUS | Status: AC | PRN
  Administered 2016-05-12: 100 mL via INTRAVENOUS

## 2016-05-12 NOTE — ED Provider Notes (Signed)
[facility]-EMERGENCY DEPT Provider Note   CSN: [id number] Arrival date & time: 05/12/16  1808  By signing my name below, I, Howard Martin, attest that this documentation has been prepared under the direction and in the presence of Raeford Razor, MD. Electronically Signed: Leone Payor, Scribe. 05/12/16. 6:40 PM.  History   Chief Complaint Chief Complaint  Patient presents with  . Abdominal Pain    The history is provided by the patient. No language interpreter was used.     HPI Comments: Howard Martin is a 21 y.o. male who presents to the Emergency Department complaining of constant LLQ abdominal pain that began this morning. He had associated nausea,  1 episode of vomiting, and intermittent fevers (TMAX 101.8) all day. He has tried motrin and Tylenol with some relief of his pain and transient relief of the fevers. He states the pain is worse with sitting up. He denies dysuria, diarrhea, testicular pain, penile pain. He denies history of abdominal surgeries.   Past Medical History:  Diagnosis Date  . Asthma   . Difficulty swallowing pills   . Fracture dislocation of finger 02/2015   left long  . History of asthma    no inhaler use in years, per pt.  . History of kyphosis     Patient Active Problem List   Diagnosis Date Noted  . Scheuermanns disease 08/13/2010  . Scoliosis of thoracic spine 08/04/2010    Past Surgical History:  Procedure Laterality Date  . CYSTOSCOPY    . MANDIBLE FRACTURE SURGERY    . MEATOTOMY    . OPEN REDUCTION INTERNAL FIXATION (ORIF) PROXIMAL PHALANX Left 02/25/2015   Procedure: OPEN REDUCTION INTERNAL FIXATION (ORIF) LEFT LONG FINGER;  Surgeon: Mack Hook, MD;  Location: [facility];  Service: Orthopedics;  Laterality: Left;  Left long.  Marland Kitchen SPINAL FUSION  12/2012   thoracic/lumbar  . TOOTH EXTRACTION  07/09/2003   removal impacted mesiodens       Home Medications    Prior to Admission medications   Medication Sig Start Date End Date  Taking? Authorizing Provider  albuterol (PROVENTIL HFA;VENTOLIN HFA) 108 (90 BASE) MCG/ACT inhaler Inhale 2 puffs into the lungs every 4 (four) hours as needed. For shortness of breath     Historical Provider, MD  citalopram (CELEXA) 10 MG tablet Take 10 mg by mouth daily.    Historical Provider, MD  HYDROcodone-acetaminophen (HYCET) 7.5-325 mg/15 ml solution Take 10 mLs by mouth every 6 (six) hours as needed for severe pain. 02/25/15   Mack Hook, MD    Family History No family history on file.  Social History Social History  Substance Use Topics  . Smoking status: Never Smoker  . Smokeless tobacco: Never Used  . Alcohol use No     Allergies   Cefprozil   Review of Systems Review of Systems  A complete 10 system review of systems was obtained and all systems are negative except as noted in the HPI and PMH.    Physical Exam Updated Vital Signs BP 109/70 (BP Location: Left Arm)   Pulse (!) 112   Temp 99.2 F (37.3 C) (Oral)   Resp 16   Ht 5\' 9"  (1.753 m)   Wt 145 lb (65.8 kg)   SpO2 97%   BMI 21.41 kg/m   Physical Exam  Constitutional: He is oriented to person, place, and time. He appears well-developed and well-nourished.  HENT:  Head: Normocephalic and atraumatic.  Eyes: EOM are normal.  Neck: Normal range  of motion.  Cardiovascular: Normal rate, regular rhythm, normal heart sounds and intact distal pulses.   Pulmonary/Chest: Effort normal and breath sounds normal. No respiratory distress.  Abdominal: Soft. He exhibits no distension. There is tenderness in the suprapubic area and left lower quadrant. There is no rebound and no guarding.  Musculoskeletal: Normal range of motion.  Neurological: He is alert and oriented to person, place, and time.  Skin: Skin is warm and dry.  Psychiatric: He has a normal mood and affect. Judgment normal.  Nursing note and vitals reviewed.    ED Treatments / Results  DIAGNOSTIC STUDIES: Oxygen Saturation is 97% on RA,  adequate by my interpretation.    COORDINATION OF CARE: 6:40 PM Discussed treatment plan with pt at bedside and pt agreed to plan.   Labs (all labs ordered are listed, but only abnormal results are displayed) Labs Reviewed  COMPREHENSIVE METABOLIC PANEL - Abnormal; Notable for the following:       Result Value   Sodium 129 (*)    Potassium 3.4 (*)    Chloride 99 (*)    Glucose, Bld 104 (*)    Calcium 8.7 (*)    ALT 12 (*)    Total Bilirubin 2.3 (*)    All other components within normal limits  CBC - Abnormal; Notable for the following:    MCHC 36.5 (*)    All other components within normal limits  URINALYSIS, ROUTINE W REFLEX MICROSCOPIC - Abnormal; Notable for the following:    Color, Urine STRAW (*)    All other components within normal limits  LIPASE, BLOOD    EKG  EKG Interpretation None       Radiology No results found.   Ct Abdomen Pelvis W Contrast  Result Date: 05/12/2016 CLINICAL DATA:  Left lower quadrant and suprapubic pain since this morning. EXAM: CT ABDOMEN AND PELVIS WITH CONTRAST TECHNIQUE: Multidetector CT imaging of the abdomen and pelvis was performed using the standard protocol following bolus administration of intravenous contrast. CONTRAST:  30mL ISOVUE-300 IOPAMIDOL (ISOVUE-300) INJECTION 61%, 100mL ISOVUE-300 IOPAMIDOL (ISOVUE-300) INJECTION 61% COMPARISON:  None. FINDINGS: Lower chest: The lung bases are clear of acute process. No pleural effusion or pulmonary lesions. The heart is normal in size. No pericardial effusion. The distal esophagus and aorta are unremarkable. Hepatobiliary: No focal hepatic lesions or intrahepatic biliary dilatation. The gallbladder is normal. No common bile duct dilatation. Pancreas: No mass, inflammation or ductal dilatation. Spleen: Normal size.  No focal lesions. Adrenals/Urinary Tract: The adrenal glands and kidneys are unremarkable. No renal, ureteral or bladder calculi. Stomach/Bowel: The stomach, duodenum, small bowel  and colon are unremarkable. No inflammatory changes, mass lesions or obstructive findings. The terminal ileum is normal. The appendix is normal. Vascular/Lymphatic: The aorta and branch vessels are normal. The major venous structures are normal. Numerous scattered mesenteric lymph nodes which could suggest mesenteric adenitis. Reproductive: Normal prostate gland and seminal vesicles. Other: No pelvic mass or adenopathy. No free pelvic fluid collections. No inguinal mass or adenopathy. No abdominal wall hernia or subcutaneous lesions. Musculoskeletal: Focal lumbar fusion hardware/Harrington rods. No complicating features. IMPRESSION: 1. Borderline mesenteric lymph nodes may suggest mesenteric adenitis. 2. No findings for diverticulitis or appendicitis. 3. No mass lesions or adenopathy. Electronically Signed   By: Rudie MeyerP.  Gallerani M.D.   On: 05/12/2016 20:14    Procedures Procedures (including critical care time)  Medications Ordered in ED Medications - No data to display   Initial Impression / Assessment and Plan / ED Course  I have reviewed the triage vital signs and the nursing notes.  Pertinent labs & imaging results that were available during my care of the patient were reviewed by me and considered in my medical decision making (see chart for details).     21 year old male with lower abdominal pain and reported fever. CT abdomen pelvis with borderline-enlarged mesenteric lymph nodes, suspect that his pain is secondary to mesenteric adenitis. CT is otherwise unremarkable. He is hemodynamically stable. Return precautions discussed.  Final Clinical Impressions(s) / ED Diagnoses   Final diagnoses:  Mesenteric adenitis    New Prescriptions New Prescriptions   No medications on file   I personally preformed the services scribed in my presence. The recorded information has been reviewed is accurate. Raeford Razor, MD.    Raeford Razor, MD 05/17/16 [phone]

## 2016-05-12 NOTE — ED Triage Notes (Signed)
Pt states he woke up this morning with lower abdominal pain accompanied by vomiting (x1 time). Denies any diarrhea.

## 2019-01-10 ENCOUNTER — Encounter (HOSPITAL_COMMUNITY): Payer: Self-pay | Admitting: Emergency Medicine

## 2019-01-10 ENCOUNTER — Other Ambulatory Visit: Payer: Self-pay

## 2019-01-10 ENCOUNTER — Emergency Department (HOSPITAL_COMMUNITY)
Admission: EM | Admit: 2019-01-10 | Discharge: 2019-01-11 | Disposition: A | Discharge status: Home / Self Care | Payer: BC Managed Care – PPO | Attending: Emergency Medicine | Admitting: Emergency Medicine

## 2019-01-10 DIAGNOSIS — R509 Fever, unspecified: Secondary | ICD-10-CM | POA: Diagnosis present

## 2019-01-10 DIAGNOSIS — Z5321 Procedure and treatment not carried out due to patient leaving prior to being seen by health care provider: Secondary | ICD-10-CM | POA: Diagnosis not present

## 2019-01-10 LAB — CBC WITH DIFFERENTIAL/PLATELET
Abs Immature Granulocytes: 0.03 10*3/uL (ref 0.00–0.07)
Basophils Absolute: 0 10*3/uL (ref 0.0–0.1)
Basophils Relative: 0 %
Eosinophils Absolute: 0.1 10*3/uL (ref 0.0–0.5)
Eosinophils Relative: 1 %
HCT: 39.6 % (ref 39.0–52.0)
Hemoglobin: 14.4 g/dL (ref 13.0–17.0)
Immature Granulocytes: 0 %
Lymphocytes Relative: 8 %
Lymphs Abs: 1 10*3/uL (ref 0.7–4.0)
MCH: 32.3 pg (ref 26.0–34.0)
MCHC: 36.4 g/dL — ABNORMAL HIGH (ref 30.0–36.0)
MCV: 88.8 fL (ref 80.0–100.0)
Monocytes Absolute: 0.9 10*3/uL (ref 0.1–1.0)
Monocytes Relative: 7 %
Neutro Abs: 10.5 10*3/uL — ABNORMAL HIGH (ref 1.7–7.7)
Neutrophils Relative %: 84 %
Platelets: 218 10*3/uL (ref 150–400)
RBC: 4.46 MIL/uL (ref 4.22–5.81)
RDW: 12.3 % (ref 11.5–15.5)
WBC: 12.6 10*3/uL — ABNORMAL HIGH (ref 4.0–10.5)
nRBC: 0 % (ref 0.0–0.2)

## 2019-01-10 LAB — COMPREHENSIVE METABOLIC PANEL
ALT: 24 U/L (ref 0–44)
AST: 29 U/L (ref 15–41)
Albumin: 4.2 g/dL (ref 3.5–5.0)
Alkaline Phosphatase: 59 U/L (ref 38–126)
Anion gap: 8 (ref 5–15)
BUN: 16 mg/dL (ref 6–20)
CO2: 26 mmol/L (ref 22–32)
Calcium: 8.9 mg/dL (ref 8.9–10.3)
Chloride: 102 mmol/L (ref 98–111)
Creatinine, Ser: 0.87 mg/dL (ref 0.61–1.24)
GFR calc Af Amer: >60 mL/min (ref >60)
GFR calc non Af Amer: >60 mL/min (ref >60)
Glucose, Bld: 104 mg/dL — ABNORMAL HIGH (ref 70–99)
Potassium: 3.3 mmol/L — ABNORMAL LOW (ref 3.5–5.1)
Sodium: 136 mmol/L (ref 135–145)
Total Bilirubin: 1 mg/dL (ref 0.3–1.2)
Total Protein: 7 g/dL (ref 6.5–8.1)

## 2019-01-10 LAB — URINALYSIS, ROUTINE W REFLEX MICROSCOPIC
Bilirubin Urine: NEGATIVE
Glucose, UA: NEGATIVE mg/dL
Hgb urine dipstick: NEGATIVE
Ketones, ur: NEGATIVE mg/dL
Leukocytes,Ua: NEGATIVE
Nitrite: NEGATIVE
Protein, ur: NEGATIVE mg/dL
Specific Gravity, Urine: 1.024 (ref 1.005–1.030)
pH: 6 (ref 5.0–8.0)

## 2019-01-10 NOTE — ED Triage Notes (Addendum)
Patient reports fever , chills, fatigue and " feeling tired/sluggish" onset this evening , respirations unlabored, no cough or congestion .

## 2019-02-23 ENCOUNTER — Ambulatory Visit: Payer: BC Managed Care – PPO | Attending: Internal Medicine

## 2019-02-23 DIAGNOSIS — Z20822 Contact with and (suspected) exposure to covid-19: Secondary | ICD-10-CM

## 2019-02-25 LAB — NOVEL CORONAVIRUS, NAA: SARS-CoV-2, NAA: NOT DETECTED

## 2020-10-15 ENCOUNTER — Other Ambulatory Visit: Payer: Self-pay

## 2020-10-15 ENCOUNTER — Ambulatory Visit
Admission: EM | Admit: 2020-10-15 | Discharge: 2020-10-15 | Disposition: A | Discharge status: Home / Self Care | Payer: BC Managed Care – PPO | Attending: Emergency Medicine | Admitting: Emergency Medicine

## 2020-10-15 DIAGNOSIS — J029 Acute pharyngitis, unspecified: Secondary | ICD-10-CM | POA: Diagnosis not present

## 2020-10-15 DIAGNOSIS — B349 Viral infection, unspecified: Secondary | ICD-10-CM

## 2020-10-15 LAB — POCT RAPID STREP A (OFFICE): Rapid Strep A Screen: NEGATIVE

## 2020-10-15 NOTE — ED Triage Notes (Signed)
Pt c/o cough, sore throat, running nose, and fatigue since Saturday. States had 2 neg home covid test.

## 2020-10-15 NOTE — ED Provider Notes (Signed)
Renaldo Fiddler    CSN: [id number] Arrival date & time: 10/15/20  4627      History   Chief Complaint Chief Complaint  Patient presents with   Cough    HPI Howard Martin is a 25 y.o. male.  Patient presents with 4-day history of fatigue, runny nose, sore throat, cough.  He denies fever, chills, rash, wheezing, shortness of breath, vomiting, diarrhea, or other symptoms.  Treatment at home with Tylenol.  He reports 2 negative at-home COVID tests.  Patient requests strep test.  His medical history includes asthma.  The history is provided by the patient and medical records.   Past Medical History:  Diagnosis Date   Asthma    Difficulty swallowing pills    Fracture dislocation of finger 02/2015   left long   History of asthma    no inhaler use in years, per pt.   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   SPINAL FUSION  12/2012   thoracic/lumbar   TOOTH EXTRACTION  07/09/2003   removal impacted mesiodens       Home Medications    Prior to Admission medications   Medication Sig Start Date End Date Taking? Authorizing Provider  acetaminophen (TYLENOL) 160 MG/5ML suspension Take 160 mg by mouth every 6 (six) hours as needed for mild pain or moderate pain.    [provider]  albuterol (PROVENTIL HFA;VENTOLIN HFA) 108 (90 BASE) MCG/ACT inhaler Inhale 2 puffs into the lungs every 4 (four) hours as needed. For shortness of breath     [provider]   ibuprofen (ADVIL,MOTRIN) 100 MG/5ML suspension Take 200 mg by mouth every 4 (four) hours as needed for fever or moderate pain.    [provider]    Family History History reviewed. No pertinent family history.  Social History Social History   Tobacco Use   Smoking status: Never   Smokeless tobacco: Never  Substance Use Topics   Alcohol use: No   Drug use: No     Allergies   Cefprozil   Review of Systems Review of Systems  Constitutional:  Positive for fatigue. Negative for chills and fever.  HENT:  Positive for rhinorrhea and sore throat. Negative for ear pain.   Respiratory:  Positive for cough. Negative for shortness of breath.   Cardiovascular:  Negative for chest pain and palpitations.  Gastrointestinal:  Negative for abdominal pain, diarrhea and vomiting.  Skin:  Negative for color change and rash.  All other systems reviewed and are negative.   Physical Exam Triage Vital Signs ED Triage Vitals  Enc Vitals Group     BP 10/15/20 0930 129/76     Pulse Rate 10/15/20 0930 94     Resp 10/15/20 0930 18     Temp 10/15/20 0930 98.1 F (36.7 C)     Temp Source 10/15/20 0930 Oral     SpO2 10/15/20 0930 96 %     Weight --  Height --      Head Circumference --      Peak Flow --      Pain Score 10/15/20 0932 2     Pain Loc --      Pain Edu? --      Excl. in [place]? --    No data found.  Updated Vital Signs BP 129/76 (BP Location: Left Arm)   Pulse 94   Temp 98.1 F (36.7 C) (Oral)   Resp 18   SpO2 96%   Visual Acuity Right Eye Distance:   Left Eye Distance:   Bilateral Distance:    Right Eye Near:   Left Eye Near:    Bilateral Near:     Physical Exam Vitals and nursing note reviewed.  Constitutional:      General: He is not in acute distress.    Appearance: He is well-developed.  HENT:     Head: Normocephalic and atraumatic.     Right Ear: Tympanic membrane normal.     Left Ear: Tympanic membrane normal.     Nose: Nose normal.      Mouth/Throat:     Mouth: Mucous membranes are moist.     Pharynx: Oropharynx is clear.  Eyes:     Conjunctiva/sclera: Conjunctivae normal.  Cardiovascular:     Rate and Rhythm: Normal rate and regular rhythm.     Heart sounds: Normal heart sounds.  Pulmonary:     Effort: Pulmonary effort is normal. No respiratory distress.     Breath sounds: Normal breath sounds.  Abdominal:     Palpations: Abdomen is soft.     Tenderness: There is no abdominal tenderness.  Musculoskeletal:     Cervical back: Neck supple.  Skin:    General: Skin is warm and dry.  Neurological:     General: No focal deficit present.     Mental Status: He is alert and oriented to person, place, and time.     Gait: Gait normal.  Psychiatric:        Mood and Affect: Mood normal.        Behavior: Behavior normal.     UC Treatments / Results  Labs (all labs ordered are listed, but only abnormal results are displayed) Labs Reviewed  NOVEL CORONAVIRUS, NAA  POCT RAPID STREP A (OFFICE)    EKG   Radiology No results found.  Procedures Procedures (including critical care time)  Medications Ordered in UC Medications - No data to display  Initial Impression / Assessment and Plan / UC Course  I have reviewed the triage vital signs and the nursing notes.  Pertinent labs & imaging results that were available during my care of the patient were reviewed by me and considered in my medical decision making (see chart for details).   Viral illness.  Rapid strep negative.  PCR COVID pending.  Instructed patient to self quarantine per CDC guidelines.  Discussed symptomatic treatment including Tylenol or ibuprofen, rest, hydration.  Instructed patient to follow up with PCP if symptoms are not improving.  Patient agrees to plan of care.    Final Clinical Impressions(s) / UC Diagnoses   Final diagnoses:  Viral illness  Sore throat     Discharge Instructions      Your rapid strep test is negative.    Your  COVID test is pending.  You should self quarantine until the test result is back.    Take Tylenol or ibuprofen as needed for fever or discomfort.  Rest and  keep yourself hydrated.    Follow-up with your primary care provider if your symptoms are not improving.          ED Prescriptions   None    PDMP not reviewed this encounter.   Mickie Bail, NP 10/15/20 [phone]

## 2020-10-15 NOTE — Discharge Instructions (Addendum)
Your rapid strep test is negative.     Your COVID test is pending.  You should self quarantine until the test result is back.    Take Tylenol or ibuprofen as needed for fever or discomfort.  Rest and keep yourself hydrated.    Follow-up with your primary care provider if your symptoms are not improving.     

## 2020-10-16 LAB — SARS-COV-2, NAA 2 DAY TAT

## 2020-10-16 LAB — NOVEL CORONAVIRUS, NAA: SARS-CoV-2, NAA: NOT DETECTED

## 2022-05-15 ENCOUNTER — Ambulatory Visit (HOSPITAL_COMMUNITY)
Admission: EM | Admit: 2022-05-15 | Discharge: 2022-05-15 | Disposition: A | Discharge status: Home / Self Care | Payer: Self-pay | Attending: Internal Medicine | Admitting: Internal Medicine

## 2022-05-15 ENCOUNTER — Encounter (HOSPITAL_COMMUNITY): Payer: Self-pay

## 2022-05-15 DIAGNOSIS — R197 Diarrhea, unspecified: Secondary | ICD-10-CM | POA: Insufficient documentation

## 2022-05-15 DIAGNOSIS — E860 Dehydration: Secondary | ICD-10-CM | POA: Insufficient documentation

## 2022-05-15 DIAGNOSIS — B349 Viral infection, unspecified: Secondary | ICD-10-CM | POA: Insufficient documentation

## 2022-05-15 DIAGNOSIS — Z1152 Encounter for screening for COVID-19: Secondary | ICD-10-CM | POA: Insufficient documentation

## 2022-05-15 DIAGNOSIS — R112 Nausea with vomiting, unspecified: Secondary | ICD-10-CM | POA: Insufficient documentation

## 2022-05-15 LAB — POCT URINALYSIS DIPSTICK, ED / UC
Glucose, UA: NEGATIVE mg/dL
Hgb urine dipstick: NEGATIVE
Ketones, ur: NEGATIVE mg/dL
Leukocytes,Ua: NEGATIVE
Nitrite: NEGATIVE
Protein, ur: NEGATIVE mg/dL
Specific Gravity, Urine: >=1.03 (ref 1.005–1.030)
Urobilinogen, UA: 0.2 mg/dL (ref 0.0–1.0)
pH: 5.5 (ref 5.0–8.0)

## 2022-05-15 LAB — POC INFLUENZA A AND B ANTIGEN (URGENT CARE ONLY)
INFLUENZA A ANTIGEN, POC: NEGATIVE
INFLUENZA B ANTIGEN, POC: NEGATIVE

## 2022-05-15 MED ORDER — ONDANSETRON 4 MG PO TBDP
ORAL_TABLET | ORAL | Status: AC
  Filled 2022-05-15: qty 1

## 2022-05-15 MED ORDER — SODIUM CHLORIDE 0.9 % IV BOLUS
1000.0000 mL | Freq: Once | INTRAVENOUS | Status: AC
  Administered 2022-05-15: 1000 mL via INTRAVENOUS

## 2022-05-15 MED ORDER — ALBUTEROL SULFATE HFA 108 (90 BASE) MCG/ACT IN AERS: 1.0000 | INHALATION_SPRAY | Freq: Four times a day (QID) | RESPIRATORY_TRACT | 0 refills | Status: AC | PRN

## 2022-05-15 MED ORDER — ONDANSETRON 4 MG PO TBDP: 4.0000 mg | ORAL_TABLET | Freq: Three times a day (TID) | ORAL | 0 refills | Status: AC | PRN

## 2022-05-15 MED ORDER — ONDANSETRON 4 MG PO TBDP
4.0000 mg | ORAL_TABLET | Freq: Once | ORAL | Status: AC
  Administered 2022-05-15: 4 mg via ORAL

## 2022-05-15 NOTE — Discharge Instructions (Signed)
Your evaluation suggests that your symptoms are most likely due to viral stomach illness (gastroenteritis) which will improve on its own with rest and fluids in the next few days.   I gave you fluids in the office to help rehydrate you.  This helped to lower your heart rate and help your blood pressure come back up.  I have prescribed an antinausea medication for you to take at home called Zofran. It is the same medication that we gave you in the office.  You may use tylenol 1,000mg  every 6 hours over the counter as needed for abdominal discomfort related to this virus.   Eat a bland diet for the next 12-24 hours (bananas, rice, white toast, and applesauce) once you are able to tolerate liquids (broth, etc). These foods are easy for your stomach to digest. Pedialyte can be purchased to help with rehydration. Drink plenty of water.   Please follow up with your primary care provider for further management. Return if you experience worsening or uncontrolled pain, inability to tolerate fluids by mouth, difficulty breathing, fevers 100.37F or greater, recurrent vomiting, or any other concerning symptoms. I hope you feel better!

## 2022-05-15 NOTE — ED Provider Notes (Signed)
[place]    CSN: [id number] Arrival date & time: 05/15/22  1140      History   Chief Complaint Chief Complaint  Patient presents with   Emesis   Diarrhea   Generalized Body Aches   Headache   Back Pain    HPI Howard Martin is a 27 y.o. male.   Patient presents to urgent care for evaluation of nausea, vomiting, diarrhea, generalized headache, and generalized bodyaches that started abruptly 2 days ago.  Fever last night at home was 102.0.  Last episode of nonbilious/nonbloody emesis was approximately 6 hours ago.  He has been sipping on Pedialyte and has been able to tolerate this without vomiting over the last several hours.  Remains nauseated.  No vision changes, dizziness, focal abdominal pain, recent abdominal surgeries, recent antibiotic use, blood/mucus in the stool/emesis, urinary symptoms, flank pain, or recent known sick contacts with similar symptoms.  He has not experienced any viral URI symptoms in the last couple of days.  History of asthma, has not needed to use his albuterol inhaler.  Currently tachycardic and afebrile without use of any antipyretic in the last 6 hours.  Blood pressure is soft compared to his normal.  Denies recent alcohol/drug use.  Reports generalized abdominal discomfort that improves after defecation.  Has attempted use of over-the-counter medications without much relief of symptoms.  States his urine appears to be sweet Tea in color.  Took a COVID test at home and it was negative.   Emesis Associated symptoms: diarrhea and headaches   Diarrhea Associated symptoms: headaches and vomiting   Headache Associated symptoms: back pain, diarrhea and vomiting   Back Pain Associated symptoms: headaches     Past Medical History:  Diagnosis Date   Asthma    Difficulty swallowing pills    Fracture dislocation of finger 02/2015   left long   History of asthma    no inhaler use in years, per pt.   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   SPINAL FUSION  12/2012   thoracic/lumbar   TOOTH EXTRACTION  07/09/2003   removal impacted mesiodens       Home Medications    Prior to Admission medications   Medication Sig Start Date End Date Taking? Authorizing Provider  albuterol (VENTOLIN HFA) 108 (90 Base) MCG/ACT inhaler Inhale 1-2 puffs into the lungs every 6 (six) hours as needed for wheezing or shortness of breath. 05/15/22  Yes Talbot Grumbling, FNP  ondansetron (ZOFRAN-ODT) 4 MG disintegrating tablet Take 1 tablet (4 mg total) by mouth every 8 (eight) hours as needed for nausea or vomiting. 05/15/22  Yes Talbot Grumbling, FNP  acetaminophen (TYLENOL) 160 MG/5ML suspension Take 160 mg by mouth every 6 (six) hours as needed for mild pain or moderate pain.    [provider]  ibuprofen (ADVIL,MOTRIN) 100 MG/5ML suspension Take 200 mg by mouth every 4 (four) hours as needed for fever or moderate pain.  [provider]    Family History History reviewed. No pertinent family history.  Social History Social History   Tobacco Use   Smoking status: Never   Smokeless tobacco: Never  Substance Use Topics   Alcohol use: No   Drug use: No     Allergies   Cefprozil   Review of Systems Review of Systems  Gastrointestinal:  Positive for diarrhea and vomiting.  Musculoskeletal:  Positive for back pain.  Neurological:  Positive for headaches.  Per HPI   Physical Exam Triage Vital Signs ED Triage  Vitals  Enc Vitals Group     BP 05/15/22 1353 107/70     Pulse Rate 05/15/22 1349 (!) 111     Resp 05/15/22 1349 18     Temp 05/15/22 1353 99 F (37.2 C)     Temp Source 05/15/22 1349 Oral     SpO2 05/15/22 1349 98 %     Weight --      Height --      Head Circumference --      Peak Flow --      Pain Score --      Pain Loc --      Pain Edu? --      Excl. in [place]? --    No data found.  Updated Vital Signs BP 107/70 (BP Location: Left Arm)   Pulse (!) 111   Temp 99 F (37.2 C) (Oral)   Resp 18   SpO2 99%   Visual Acuity Right Eye Distance:   Left Eye Distance:   Bilateral Distance:    Right Eye Near:   Left Eye Near:    Bilateral Near:     Physical Exam Vitals and nursing note reviewed.  Constitutional:      Appearance: He is ill-appearing. He is not toxic-appearing.  HENT:     Head: Normocephalic and atraumatic.     Right Ear: Hearing and external ear normal.     Left Ear: Hearing and external ear normal.     Nose: Nose normal.     Mouth/Throat:     Lips: Pink.     Mouth: Mucous membranes are dry. No injury.     Tongue: No lesions. Tongue does not deviate from midline.     Palate: No mass and lesions.     Pharynx: Oropharynx is clear. Uvula midline. No pharyngeal swelling, oropharyngeal exudate, posterior oropharyngeal erythema or uvula swelling.     Tonsils: No tonsillar exudate or tonsillar abscesses.  Eyes:     General: Lids are normal. Vision grossly intact. Gaze aligned appropriately.     Extraocular Movements: Extraocular movements intact.     Conjunctiva/sclera: Conjunctivae normal.  Cardiovascular:     Rate and Rhythm: Normal rate and regular rhythm.     Heart sounds: Normal heart sounds, S1 normal and S2 normal.  Pulmonary:     Effort: Pulmonary effort is normal. No respiratory distress.     Breath sounds: Normal air entry. Wheezing present.     Comments: Singular expiratory wheeze heard to the left upper lung field without respiratory distress,  retractions, or increased respiratory effort.  Speaking in full sentences without difficulty. Abdominal:     General: Abdomen is flat. Bowel sounds are normal.     Palpations: Abdomen is soft.     Tenderness: There is generalized abdominal tenderness. There is no right CVA tenderness, left CVA tenderness, guarding or rebound. Negative signs include Murphy's sign and McBurney's sign.  Hernia: No hernia is present.     Comments: No peritoneal signs to abdominal exam.  Musculoskeletal:     Cervical back: Neck supple.  Skin:    General: Skin is warm and dry.     Capillary Refill: Capillary refill takes less than 2 seconds.     Findings: No rash.  Neurological:     General: No focal deficit present.     Mental Status: He is alert and oriented to person, place, and time. Mental status is at baseline.     Cranial Nerves: No dysarthria or facial asymmetry.  Psychiatric:        Mood and Affect: Mood normal.        Speech: Speech normal.        Behavior: Behavior normal.        Thought Content: Thought content normal.        Judgment: Judgment normal.      UC Treatments / Results  Labs (all labs ordered are listed, but only abnormal results are displayed) Labs Reviewed  POCT URINALYSIS DIPSTICK, ED / UC - Abnormal; Notable for the following components:      Result Value   Bilirubin Urine SMALL (*)    All other components within normal limits  SARS CORONAVIRUS 2 (TAT 6-24 HRS)  POC INFLUENZA A AND B ANTIGEN (URGENT CARE ONLY)    EKG   Radiology No results found.  Procedures Procedures (including critical care time)  Medications Ordered in UC Medications  ondansetron (ZOFRAN-ODT) disintegrating tablet 4 mg (4 mg Oral Given 05/15/22 1418)  sodium chloride 0.9 % bolus 1,000 mL (0 mLs Intravenous Stopped 05/15/22 1458)    Initial Impression / Assessment and Plan / UC Course  I have reviewed the triage vital signs and the nursing notes.  Pertinent labs & imaging results that  were available during my care of the patient were reviewed by me and considered in my medical decision making (see chart for details).   1.  Dehydration, N/V/D, viral syndrome Patient initially ill-appearing with tachycardia and soft blood pressure.  Slight wheeze heard to the left upper lung field, however denies shortness of breath and chest pain.  Stable cardiopulmonary exam, therefore deferred imaging.  Likely very dehydrated due to persistent nausea and vomiting/diarrhea.  1 L normal saline fluid bolus given via IV.  Patient reports significant improvement in overall symptoms after fluid bolus and heart rate/blood pressure normalized.  Blood pressure after fluid bolus 117/82, heart rate 90.  Advised to continue pushing fluids at home to stay well-hydrated and prevent further dehydration.  Zofran 4 mg ODT given in clinic.  May take this every 8 hours as needed at home.  Albuterol inhaler refilled as he has a history of asthma and does not currently have an inhaler.  Advised to use this every 4-6 hours as needed for shortness of breath, cough, and wheeze.  COVID-19 testing is pending and will come back in the next 12 to 24 hours.  Influenza testing in clinic is negative.  May continue use of ibuprofen/Tylenol as needed for fever/chills and aches and pains.  Discussed physical exam and available lab work findings in clinic with patient.  Counseled patient regarding appropriate use of medications and potential side effects for all medications recommended or prescribed today. Discussed red flag signs and symptoms of worsening condition,when to call the PCP office, return to urgent care, and when to seek higher level of care in the emergency department. Patient verbalizes understanding and agreement with  plan. All questions answered. Patient discharged in stable condition.    Final Clinical Impressions(s) / UC Diagnoses   Final diagnoses:  Nausea vomiting and diarrhea  Dehydration  Viral syndrome   Encounter for screening for COVID-19     Discharge Instructions      Your evaluation suggests that your symptoms are most likely due to viral stomach illness (gastroenteritis) which will improve on its own with rest and fluids in the next few days.   I gave you fluids in the office to help rehydrate you.  This helped to lower your heart rate and help your blood pressure come back up.  I have prescribed an antinausea medication for you to take at home called Zofran. It is the same medication that we gave you in the office.  You may use tylenol 1,000mg  every 6 hours over the counter as needed for abdominal discomfort related to this virus.   Eat a bland diet for the next 12-24 hours (bananas, rice, white toast, and applesauce) once you are able to tolerate liquids (broth, etc). These foods are easy for your stomach to digest. Pedialyte can be purchased to help with rehydration. Drink plenty of water.   Please follow up with your primary care provider for further management. Return if you experience worsening or uncontrolled pain, inability to tolerate fluids by mouth, difficulty breathing, fevers 100.21F or greater, recurrent vomiting, or any other concerning symptoms. I hope you feel better!       ED Prescriptions     Medication Sig Dispense Auth. Provider   albuterol (VENTOLIN HFA) 108 (90 Base) MCG/ACT inhaler Inhale 1-2 puffs into the lungs every 6 (six) hours as needed for wheezing or shortness of breath. 8 g Joella Prince M, FNP   ondansetron (ZOFRAN-ODT) 4 MG disintegrating tablet Take 1 tablet (4 mg total) by mouth every 8 (eight) hours as needed for nausea or vomiting. 20 tablet Talbot Grumbling, FNP      PDMP not reviewed this encounter.   Talbot Grumbling, [place] 05/15/22 1529

## 2022-05-15 NOTE — ED Triage Notes (Addendum)
Pt reports diarrhea,vomiting and body aches x 2 days. Pt reports temp last night was 102-103, pt took Tylenol cold and flu.  Pt reports lower abdominal and back pain today.

## 2022-05-16 LAB — SARS CORONAVIRUS 2 (TAT 6-24 HRS): SARS Coronavirus 2: NEGATIVE

## 2023-03-07 ENCOUNTER — Ambulatory Visit
Admission: EM | Admit: 2023-03-07 | Discharge: 2023-03-07 | Disposition: A | Discharge status: Home / Self Care | Payer: Managed Care, Other (non HMO) | Attending: Family Medicine | Admitting: Family Medicine

## 2023-03-07 ENCOUNTER — Ambulatory Visit (INDEPENDENT_AMBULATORY_CARE_PROVIDER_SITE_OTHER): Payer: Self-pay

## 2023-03-07 DIAGNOSIS — M546 Pain in thoracic spine: Secondary | ICD-10-CM

## 2023-03-07 NOTE — ED Provider Notes (Signed)
[facility]    CSN: [id number] Arrival date & time: 03/07/23  1718      History   Chief Complaint No chief complaint on file.   HPI Howard Martin is a 28 y.o. male.   Patient presenting today with 1 week history of back pain, weakness after a fall on the ice.  States he largely fell onto his backside and caught himself on his wrist.  Was seen for the wrist pain already as this was hurting the worst initially but this is improving and now his back pain is getting worse.  He states more so than pain is the weakness.  He went to pick up his son and could barely do it, stating that his back "gave out on him".  Denies radiation of pain down legs, numbness tingling or weakness of legs, bowel or bladder incontinence, saddle anesthesias.  He does have a history of significant spinal issues status post diffuse spinal fusion of the thoracic and lumbar spine    Past Medical History:  Diagnosis Date   Asthma    Difficulty swallowing pills    Fracture dislocation of finger 02/2015   left long   History of asthma    no inhaler use in years, per pt.   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   SPINAL FUSION  12/2012   thoracic/lumbar   TOOTH EXTRACTION  07/09/2003   removal impacted mesiodens       Home Medications    Prior to Admission medications   Medication Sig Start Date End Date Taking? Authorizing Provider  acetaminophen (TYLENOL) 160 MG/5ML suspension Take 160 mg by  mouth every 6 (six) hours as needed for mild pain or moderate pain.    [provider]  albuterol (VENTOLIN HFA) 108 (90 Base) MCG/ACT inhaler Inhale 1-2 puffs into the lungs every 6 (six) hours as needed for wheezing or shortness of breath. 05/15/22   Carlisle Beers, FNP  ibuprofen (ADVIL,MOTRIN) 100 MG/5ML suspension Take 200 mg by mouth every 4 (four) hours as needed for fever or moderate pain.    [provider]  ondansetron (ZOFRAN-ODT) 4 MG disintegrating tablet Take 1 tablet (4 mg total) by mouth every 8 (eight) hours as needed for nausea or vomiting. 05/15/22   Carlisle Beers, FNP    Family History History reviewed. No pertinent family history.  Social History Social History   Tobacco Use   Smoking status: Never   Smokeless tobacco: Never  Substance Use Topics   Alcohol use: No   Drug use: No     Allergies   Cefprozil   Review of Systems Review of Systems Per HPI  Physical Exam Triage Vital Signs ED Triage Vitals  Encounter Vitals Group     BP 03/07/23 1728 (!) 165/85     Systolic BP Percentile --      Diastolic BP Percentile --      Pulse Rate 03/07/23 1728 (!) 102  Resp 03/07/23 1728 15     Temp 03/07/23 1728 98.5 F (36.9 C)     Temp Source 03/07/23 1728 Oral     SpO2 03/07/23 1728 96 %     Weight --      Height --      Head Circumference --      Peak Flow --      Pain Score 03/07/23 1730 5     Pain Loc --      Pain Education --      Exclude from Growth Chart --    No data found.  Updated Vital Signs BP (!) 165/85 (BP Location: Right Arm)   Pulse (!) 102   Temp 98.5 F (36.9 C) (Oral)   Resp 15   SpO2 96%   Visual Acuity Right Eye Distance:   Left Eye Distance:   Bilateral Distance:    Right Eye Near:   Left Eye Near:    Bilateral Near:     Physical Exam Vitals and nursing note reviewed.  Constitutional:      Appearance: Normal appearance.  HENT:     Head: Atraumatic.  Eyes:     Extraocular  Movements: Extraocular movements intact.     Conjunctiva/sclera: Conjunctivae normal.  Cardiovascular:     Rate and Rhythm: Normal rate and regular rhythm.  Pulmonary:     Effort: Pulmonary effort is normal.     Breath sounds: Normal breath sounds.  Musculoskeletal:        General: No swelling or tenderness. Normal range of motion.     Cervical back: Normal range of motion and neck supple.     Comments: No midline spinal tenderness to palpation diffusely.  Normal gait and range of motion.  Negative straight leg raise bilateral lower extremities.  Skin:    General: Skin is warm and dry.  Neurological:     General: No focal deficit present.     Mental Status: He is oriented to person, place, and time.     Motor: No weakness.     Gait: Gait normal.     Comments: Bilateral lower extremities neurovascularly intact  Psychiatric:        Mood and Affect: Mood normal.        Thought Content: Thought content normal.        Judgment: Judgment normal.      UC Treatments / Results  Labs (all labs ordered are listed, but only abnormal results are displayed) Labs Reviewed - No data to display  EKG   Radiology DG Thoracic Spine 2 View Result Date: 03/07/2023 CLINICAL DATA:  Acute back pain EXAM: THORACIC SPINE 2 VIEWS COMPARISON:  06/22/2010 FINDINGS: Interval bilateral posterolateral rod and pedicle screw fixation from the T4 level extending down into the lumbar spine and beyond the inferior margin of imaging. No clear complicating feature identified. No visible fracture or acute bony findings. IMPRESSION: 1. Interval bilateral posterolateral rod and pedicle screw fixation from the T4 level extending down into the lumbar spine and beyond the inferior margin of imaging. No acute bony finding or specific complicating feature observed. Electronically Signed   By: Gaylyn Rong M.D.   On: 03/07/2023 18:28    Procedures Procedures (including critical care time)  Medications Ordered in  UC Medications - No data to display  Initial Impression / Assessment and Plan / UC Course  I have reviewed the triage vital signs and the nursing notes.  Pertinent labs & imaging results that were available during  my care of the patient were reviewed by me and considered in my medical decision making (see chart for details).     X-ray today without acute abnormalities.  He declines prescription medications as he has trouble swallowing pills.  Discussed heat, massage, muscle rubs, over-the-counter pain relievers as needed.  Final Clinical Impressions(s) / UC Diagnoses   Final diagnoses:  Acute left-sided thoracic back pain     Discharge Instructions      Your x-ray today was reassuring with no acute changes.  Use heat, massage, muscle rubs and over-the-counter pain relievers.    ED Prescriptions   None    PDMP not reviewed this encounter.   Roosvelt Maser [place], [place] 03/07/23 [phone]

## 2023-03-07 NOTE — Discharge Instructions (Signed)
Your x-ray today was reassuring with no acute changes.  Use heat, massage, muscle rubs and over-the-counter pain relievers.

## 2023-03-07 NOTE — ED Triage Notes (Signed)
Pt reports back pain, pt states last Monday he fell on Ice landing on his back and wrist, wrist pain started was seen for wrist with a bruise. Friday he went to pick up his son and couldn't and started feeling weak, 2014 had spinal fusion surgery.

## 2023-08-11 ENCOUNTER — Emergency Department (HOSPITAL_COMMUNITY)

## 2023-08-11 ENCOUNTER — Emergency Department (HOSPITAL_COMMUNITY)
Admission: EM | Admit: 2023-08-11 | Discharge: 2023-08-11 | Disposition: A | Discharge status: Home / Self Care | Attending: Emergency Medicine | Admitting: Emergency Medicine

## 2023-08-11 ENCOUNTER — Encounter (HOSPITAL_COMMUNITY): Payer: Self-pay

## 2023-08-11 ENCOUNTER — Other Ambulatory Visit: Payer: Self-pay

## 2023-08-11 DIAGNOSIS — S39012A Strain of muscle, fascia and tendon of lower back, initial encounter: Secondary | ICD-10-CM

## 2023-08-11 DIAGNOSIS — M545 Low back pain, unspecified: Secondary | ICD-10-CM | POA: Diagnosis present

## 2023-08-11 DIAGNOSIS — R2681 Unsteadiness on feet: Secondary | ICD-10-CM

## 2023-08-11 LAB — CBC WITH DIFFERENTIAL/PLATELET
Abs Immature Granulocytes: 0.01 10*3/uL (ref 0.00–0.07)
Basophils Absolute: 0 10*3/uL (ref 0.0–0.1)
Basophils Relative: 1 %
Eosinophils Absolute: 0.1 10*3/uL (ref 0.0–0.5)
Eosinophils Relative: 1 %
HCT: 40.7 % (ref 39.0–52.0)
Hemoglobin: 14.5 g/dL (ref 13.0–17.0)
Immature Granulocytes: 0 %
Lymphocytes Relative: 35 %
Lymphs Abs: 1.7 10*3/uL (ref 0.7–4.0)
MCH: 31.9 pg (ref 26.0–34.0)
MCHC: 35.6 g/dL (ref 30.0–36.0)
MCV: 89.5 fL (ref 80.0–100.0)
Monocytes Absolute: 0.4 10*3/uL (ref 0.1–1.0)
Monocytes Relative: 9 %
Neutro Abs: 2.7 10*3/uL (ref 1.7–7.7)
Neutrophils Relative %: 54 %
Platelets: 247 10*3/uL (ref 150–400)
RBC: 4.55 MIL/uL (ref 4.22–5.81)
RDW: 11.9 % (ref 11.5–15.5)
WBC: 4.9 10*3/uL (ref 4.0–10.5)
nRBC: 0 % (ref 0.0–0.2)

## 2023-08-11 LAB — COMPREHENSIVE METABOLIC PANEL WITH GFR
ALT: 21 U/L (ref 0–44)
AST: 20 U/L (ref 15–41)
Albumin: 4.2 g/dL (ref 3.5–5.0)
Alkaline Phosphatase: 48 U/L (ref 38–126)
Anion gap: 9 (ref 5–15)
BUN: 10 mg/dL (ref 6–20)
CO2: 26 mmol/L (ref 22–32)
Calcium: 9.1 mg/dL (ref 8.9–10.3)
Chloride: 103 mmol/L (ref 98–111)
Creatinine, Ser: 0.72 mg/dL (ref 0.61–1.24)
GFR, Estimated: >60 mL/min (ref >60)
Glucose, Bld: 93 mg/dL (ref 70–99)
Potassium: 4.4 mmol/L (ref 3.5–5.1)
Sodium: 138 mmol/L (ref 135–145)
Total Bilirubin: 1 mg/dL (ref 0.0–1.2)
Total Protein: 7.3 g/dL (ref 6.5–8.1)

## 2023-08-11 LAB — CK: Total CK: 119 U/L (ref 49–397)

## 2023-08-11 LAB — MAGNESIUM: Magnesium: 2.1 mg/dL (ref 1.7–2.4)

## 2023-08-11 MED ORDER — METHOCARBAMOL 750 MG PO TABS: 750.0000 mg | ORAL_TABLET | Freq: Three times a day (TID) | ORAL | 0 refills | Status: AC

## 2023-08-11 MED ORDER — PREDNISONE 10 MG PO TABS: 40.0000 mg | ORAL_TABLET | Freq: Every day | ORAL | 0 refills | Status: AC

## 2023-08-11 NOTE — ED Triage Notes (Signed)
 Pt arrived via POV c/o lower back pain X 2 days. Pt reports he had a spinal fusion 11 years ago. Pt reports he felt a sharp pain while working and when he put a heavy bag on his back.

## 2023-08-11 NOTE — ED Notes (Signed)
 Pt reports he has a dysphagia problem and has difficulty swallowing pills.

## 2023-08-11 NOTE — Discharge Instructions (Signed)
 Please follow-up closely with your primary care doctor on an outpatient basis.  Follow-up closely with neurology and neurosurgery on outpatient as well.  Return to emergency department immediately for any new or worsening symptoms.

## 2023-08-11 NOTE — ED Provider Notes (Signed)
 [place] EMERGENCY DEPARTMENT AT [facility] Provider Note   CSN: [id number] Arrival date & time: 08/11/23  1136     Patient presents with: Back Pain   Howard Martin is a 28 y.o. male.   Patient is a 28 year old male who presents to the emergency department with a chief complaint of pain to his lower back and left lower back with radiation down his left leg which has been ongoing for approximate the past 2 days.  He notes he does have a history of spinal fusion approximately 11 years ago.  He does note that he had a fall yesterday.  He notes that the pain is worse with movement and improves with rest.  He denies any associate abdominal pain, nausea, vomiting, diarrhea.  He denies any dysuria or hematuria.  He has had no urinary bowel incontinence, saddle paresthesias.  He does note that he has felt as though his gait has been more unstable.  He also notes that he has previously seen his primary care doctor secondary to some ongoing gait instability as well as weakness to his left side.  He notes that there is a referral in place to see neurology but this is not for another 4 months.   Back Pain      Prior to Admission medications   Medication Sig Start Date End Date Taking? Authorizing Provider  acetaminophen  (TYLENOL ) 160 MG/5ML suspension Take 160 mg by mouth every 6 (six) hours as needed for mild pain or moderate pain.    [provider]  albuterol  (VENTOLIN  HFA) 108 (90 Base) MCG/ACT inhaler Inhale 1-2 puffs into the lungs every 6 (six) hours as needed for wheezing or shortness of breath. 05/15/22   Enedelia Dorna HERO, FNP  ibuprofen  (ADVIL ,MOTRIN ) 100 MG/5ML suspension Take 200 mg by mouth every 4 (four) hours as needed for fever or moderate pain.    [provider]  ondansetron  (ZOFRAN -ODT) 4 MG disintegrating tablet Take 1 tablet (4 mg total) by mouth every 8 (eight) hours as needed for nausea or vomiting. 05/15/22   Enedelia Dorna HERO, FNP     Allergies: Cefprozil    Review of Systems  Musculoskeletal:  Positive for back pain.  All other systems reviewed and are negative.   Updated Vital Signs BP (!) 169/112 (BP Location: Right Arm)   Pulse 78   Temp 98.5 F (36.9 C)   Resp 18   Ht 5' 9 (1.753 m)   Wt 65.8 kg   SpO2 98%   BMI 21.42 kg/m   Physical Exam Vitals and nursing note reviewed.  Constitutional:      Appearance: Normal appearance.  HENT:     Head: Normocephalic and atraumatic.     Nose: Nose normal.     Mouth/Throat:     Mouth: Mucous membranes are moist.   Eyes:     Extraocular Movements: Extraocular movements intact.     Conjunctiva/sclera: Conjunctivae normal.     Pupils: Pupils are equal, round, and reactive to light.    Cardiovascular:     Rate and Rhythm: Normal rate and regular rhythm.     Pulses: Normal pulses.     Heart sounds: Normal heart sounds. No murmur heard.    No gallop.  Pulmonary:     Effort: Pulmonary effort is normal. No respiratory distress.     Breath sounds: Normal breath sounds. No stridor. No wheezing, rhonchi or rales.  Abdominal:     General: Abdomen is flat. Bowel sounds are  normal. There is no distension.     Palpations: Abdomen is soft.     Tenderness: There is no abdominal tenderness. There is no guarding.   Musculoskeletal:        General: No swelling or deformity. Normal range of motion.     Cervical back: Normal range of motion and neck supple.     Comments: Tender to palpation noted over lumbar spine and paraspinous muscles, no flank tenderness, nontender palpation over thoracic spine throughout, no step-off or deformity   Skin:    General: Skin is warm and dry.   Neurological:     General: No focal deficit present.     Mental Status: He is alert and oriented to person, place, and time. Mental status is at baseline.     Cranial Nerves: No cranial nerve deficit.     Sensory: No sensory deficit.     Motor: No weakness.     Coordination:  Coordination normal.     Gait: Gait normal.     Deep Tendon Reflexes: Reflexes normal.     Comments: Extension bilateral great toes intact, extension and flexion at hips intact  Psychiatric:        Mood and Affect: Mood normal.        Behavior: Behavior normal.        Thought Content: Thought content normal.        Judgment: Judgment normal.     (all labs ordered are listed, but only abnormal results are displayed) Labs Reviewed  COMPREHENSIVE METABOLIC PANEL WITH GFR  CBC WITH DIFFERENTIAL/PLATELET  MAGNESIUM  CK    EKG: None  Radiology: No results found.   Procedures   Medications Ordered in the ED - No data to display                                  Medical Decision Making Patient is doing well at this time and is stable for discharge home.  Discussed with patient that all imaging in the emergency department has been unremarkable.  Blood work has demonstrated no acute changes as well.  Suspect muscle strain of the lower back at this point.  Low suspicion for cauda equina syndrome, vertebral mellitus, epidural abscess at this time given the unremarkable MRI.  Patient has no focal abdominal tenderness and do not suspect acute intra-abdominal surgical process at this time.  He has no urinary complaints as well.  Discussed with patient with the need for close follow-up with neurology on outpatient basis given his changes with gait over the past few months and referral has been placed.  Also provided follow-up with neurosurgery for further evaluation of his lower back pain.  Strict return precautions were provided for any new or worsening symptoms.  Patient and family voiced understanding and had no additional questions.  Amount and/or Complexity of Data Reviewed Labs: ordered. Radiology: ordered.  Risk Prescription drug management.        Final diagnoses:  None    ED Discharge Orders     None          Daralene Lonni JONETTA DEVONNA 08/11/23 1405     Towana Ozell BROCKS, MD 08/11/23 1734

## 2023-10-07 ENCOUNTER — Encounter: Payer: Self-pay | Admitting: Neurology

## 2023-10-07 ENCOUNTER — Ambulatory Visit (INDEPENDENT_AMBULATORY_CARE_PROVIDER_SITE_OTHER): Admitting: Neurology

## 2023-10-07 VITALS — BP 121/78 | HR 98 | Ht 69.0 in | Wt 161.0 lb

## 2023-10-07 DIAGNOSIS — R270 Ataxia, unspecified: Secondary | ICD-10-CM

## 2023-10-07 DIAGNOSIS — R269 Unspecified abnormalities of gait and mobility: Secondary | ICD-10-CM | POA: Insufficient documentation

## 2023-10-07 DIAGNOSIS — R471 Dysarthria and anarthria: Secondary | ICD-10-CM | POA: Diagnosis not present

## 2023-10-07 NOTE — Progress Notes (Signed)
 Chief Complaint  Patient presents with   Howard Martin    New patient (paper) here with mother for referral for left sided weakness. Patient states he always had some balance issues growing up. He had a spinal fusion surgery in 2014. For 1 yr or so he was doing well, could run/jump and play basketball. Between 2018 and 2020 he noticed coordination issues, stumbling, balance trouble L worse than R. It has gotten worse over last 2 years. He cannot go up stairs without rails. has been accused of being drunk 2x. He cannot run. Cannot work. 5-6 falls in last 6 mos.      ASSESSMENT AND PLAN  Howard Martin is a 28 y.o. male   Slowly worsening ataxia  Significant cerebellum sign examination,  Areflexia, he also has mild bulbar, neck flexion, proximal upper and lower extremity muscle weakness,  Potentially localized to central nervous system, most worrisome for autosomal recessive degenerative disorder  Discussed extensively with patient and his mother, they would like to initiate evaluation as soon as possible locally, and be referred to academic for second opinion.  Referred to ataxic clinic at [facility]  Laboratory evaluation to rule out treatable etiology including paraneoplastic syndrome, acetylcholine receptor antibody,  EMG nerve conduction study  MRI of cervical spine  [facility] comprehensive ataxia genetic testing     DIAGNOSTIC DATA (LABS, IMAGING, TESTING) - I reviewed patient records, labs, notes, testing and imaging myself where available.   MEDICAL HISTORY:  Howard Martin, is a 28 year old male, accompanied by his mother seen in request by his primary care from [facility] practitioner [place], Raina Elizabeth, for evaluation of balance difficulty, initial evaluation was on October 07, 2023    History is obtained from the patient and review of electronic medical records. I personally reviewed pertinent available imaging films in PACS.   PMHx of  Hx of  mandible fracture surgery at age 33. Kyphosis, spinal fusion thoracic/lumber in Nov 2014.  He was born full-term, denied developmental delay, known learning disability, at age 53, he was noted to have hump at the lower neck region, developed severe kyphosis after he went through growing spur around age 12, eventually had thoracolumbar decompression surgery in November 2014, he recovered very well, able to go back to high school, kept a fairly active life, including basketball, football,  He has 2 children at age 15 and 28, he works as a Herbalist, including crawling underneath the basement, carry spraying liquid,  Around 2019, he noticed gradual onset lack of coordination, involving his left arm and neck more, gradually getting worse over the past few years, walk like a drunk, in June 2025, he fell at work, now is doing office job,  Had MRI of the brain on August 11, 2023 that was normal MRI of lumbar, partially imaging thoracolumbar fusion, extended from thoracic to L3, mild degenerative changes, no evidence of cord or nerve root compression  Laboratory in June 2025 showed normal CPK CBC CMP magnesium level  He was found to have mild slurred speech, was not aware to by patient himself of his mother, apparently it has been a chronic problem for him, he denies difficulty swallowing regular food, but long history of difficulty swallowing pills,   His mother's first-degree cousin has multiple sclerosis, otherwise denies family history of similar disease  He has anxiety, occasionally panic attack, denies learning disability during his school years  PHYSICAL EXAM:   Vitals:   10/07/23 1114  BP: 121/78  Pulse:  98  Weight: 161 lb (73 kg)  Height: 5' 9 (1.753 m)     Body mass index is 23.78 kg/m.  PHYSICAL EXAMNIATION:  Gen: NAD, conversant, well nourised, well groomed                     Cardiovascular: Regular rate rhythm, no peripheral edema, warm, nontender. Eyes:  Conjunctivae clear without exudates or hemorrhage Neck: Supple, no carotid bruits. Pulmonary: Clear to auscultation bilaterally   NEUROLOGICAL EXAM:  MENTAL STATUS: Speech/cognition: Mild dysarthria,   CRANIAL NERVES: CN II: Visual fields are full to confrontation. Pupils are round equal and briskly reactive to light. CN III, IV, VI: extraocular movement are normal. No ptosis. CN V: Facial sensation is intact to light touch CN VII: Mild eye closure cheek puff weakness CN VIII: Hearing is normal to causal conversation. CN IX, X: Phonation is normal. CN XI: Head turning and shoulder shrug are intact CN XII: There was no tongue atrophy or fasciculation,  MOTOR: Mild neck flexion, proximal upper and lower extremity muscle weakness  REFLEXES: Areflexia  SENSORY: Intact to light touch, pinprick and vibratory sensation are intact in fingers and toes.  COORDINATION: Mild to moderate truncal ataxia, mild dysmetria on finger-to-nose, dysdiadochokinesia both upper extremity, moderate on heel-to-shin dysmetria,   GAIT/STANCE: Need push-up to get up from seated position, wide-based, unsteady, could not perform tandem,  REVIEW OF SYSTEMS:  Full 14 system review of systems performed and notable only for as above All other review of systems were negative.   ALLERGIES: Allergies  Allergen Reactions   Cefprozil Rash and Other (See Comments)    UNKNOWN    HOME MEDICATIONS: Current Outpatient Medications  Medication Sig Dispense Refill   albuterol  (VENTOLIN  HFA) 108 (90 Base) MCG/ACT inhaler Inhale 1-2 puffs into the lungs every 6 (six) hours as needed for wheezing or shortness of breath. (Patient not taking: Reported on 10/07/2023) 8 g 0   ibuprofen  (ADVIL ,MOTRIN ) 100 MG/5ML suspension Take 200 mg by mouth every 4 (four) hours as needed for fever or moderate pain. (Patient not taking: Reported on 10/07/2023)     methocarbamol  (ROBAXIN ) 750 MG tablet Take 1 tablet (750 mg total) by mouth 3  (three) times daily. (Patient not taking: Reported on 10/07/2023) 21 tablet 0   ondansetron  (ZOFRAN -ODT) 4 MG disintegrating tablet Take 1 tablet (4 mg total) by mouth every 8 (eight) hours as needed for nausea or vomiting. (Patient not taking: Reported on 10/07/2023) 20 tablet 0   No current facility-administered medications for this visit.    PAST MEDICAL HISTORY: Past Medical History:  Diagnosis Date   Asthma    Difficulty swallowing pills    Fracture dislocation of finger 02/2015   left long   History of asthma    no inhaler use in years, per pt.   History of kyphosis     PAST SURGICAL HISTORY: Past Surgical History:  Procedure Laterality Date   CYSTOSCOPY     MANDIBLE FRACTURE SURGERY     MEATOTOMY     OPEN REDUCTION INTERNAL FIXATION (ORIF) PROXIMAL PHALANX Left 02/25/2015   Procedure: OPEN REDUCTION INTERNAL FIXATION (ORIF) LEFT LONG FINGER;  Surgeon: Alm Hummer, MD;  Location: [facility];  Service: Orthopedics;  Laterality: Left;  Left long.   SPINAL FUSION  12/2012   thoracic/lumbar   TOOTH EXTRACTION  07/09/2003   removal impacted mesiodens    FAMILY HISTORY: Family History  Problem Relation Age of Onset   Autism Other  Multiple sclerosis Other        second cousin   Skin cancer Maternal Great-grandmother    Autism Son     SOCIAL HISTORY: Social History   Socioeconomic History   Marital status: Single    Spouse name: Not on file   Number of children: 2   Years of education: Not on file   Highest education level: Not on file  Occupational History   Occupation: Dentist: UNEMPLOYED  Tobacco Use   Smoking status: Never   Smokeless tobacco: Never  Vaping Use   Vaping status: Never Used  Substance and Sexual Activity   Alcohol use: Never   Drug use: Never   Sexual activity: Not on file  Other Topics Concern   Not on file  Social History Narrative   Lives at home alone   Right handed   Caffeine: 1 energy drink or soda  each day    Social Drivers of Corporate investment banker Strain: Not on file  Food Insecurity: Not on file  Transportation Needs: Not on file  Physical Activity: Not on file  Stress: Not on file  Social Connections: Not on file  Intimate Partner Violence: Not on file      Modena Callander, M.D. Ph.D.  [facility] [address] [place], [place] [address] Ph: [phone] Fax: [phone]  CC:  Benjamin Raina Elizabeth, NP [address] [place],  [place] [address]  Roni, [facility]

## 2023-10-07 NOTE — Patient Instructions (Signed)
 Location [facility] Department of Neurology  Ataxia Clinic: [address], [place] [address]  Website [email or web address]  Clinic Phone Number [phone]  Clinic Fax [phone]  Clinic Status Accepting New Patients / Referrals Required / Adult Patients Accepted (Dr. Kip will see patients age 28 and older)  Providers Dr. Barabara Stephania Kip Dr. Cinderella Jeans How to Schedule an Appointment Have their physician place a referral to [facility]- specify ataxia clinic. Can call [phone] for scheduling assistance. New patient referrals are then reviewed by clinic staff including senior coordinator Rolin Graft at [email or web address].

## 2023-10-10 ENCOUNTER — Telehealth: Payer: Self-pay | Admitting: Neurology

## 2023-10-10 ENCOUNTER — Telehealth: Payer: Self-pay

## 2023-10-10 NOTE — Telephone Encounter (Signed)
 Referral for neurology fax to [facility]. Phone: [phone], Fax: [phone].

## 2023-10-10 NOTE — Telephone Encounter (Signed)
 [employer] forms faxe requested ins verfication prior to collection. In pending folder on Howard Martin desk

## 2023-10-11 LAB — MULTIPLE MYELOMA PANEL, SERUM
Albumin SerPl Elph-Mcnc: 4 g/dL (ref 2.9–4.4)
Albumin/Glob SerPl: 1.2 (ref 0.7–1.7)
Alpha 1: 0.2 g/dL (ref 0.0–0.4)
Alpha2 Glob SerPl Elph-Mcnc: 0.6 g/dL (ref 0.4–1.0)
B-Globulin SerPl Elph-Mcnc: 1.1 g/dL (ref 0.7–1.3)
Gamma Glob SerPl Elph-Mcnc: 1.4 g/dL (ref 0.4–1.8)
Globulin, Total: 3.4 g/dL (ref 2.2–3.9)
IgA/Immunoglobulin A, Serum: 344 mg/dL (ref 90–386)
IgG (Immunoglobin G), Serum: 1177 mg/dL (ref 603–1613)
IgM (Immunoglobulin M), Srm: 92 mg/dL (ref 20–172)
Total Protein: 7.4 g/dL (ref 6.0–8.5)

## 2023-10-11 LAB — AUTOIMMUNE NEUROLOGY AB: VGCC Antibody: <1 pmol/L (ref 0.0–30.0)

## 2023-10-11 LAB — ANA W/REFLEX IF POSITIVE

## 2023-10-11 LAB — HIV ANTIBODY (ROUTINE TESTING W REFLEX): HIV Screen 4th Generation wRfx: NONREACTIVE

## 2023-10-11 LAB — SEDIMENTATION RATE: Sed Rate: 2 mm/h (ref 0–15)

## 2023-10-11 LAB — C-REACTIVE PROTEIN: CRP: <1 mg/L (ref 0–10)

## 2023-10-11 LAB — ACETYLCHOLINE RECEPTOR, BINDING: AChR Binding Ab, Serum: <0.07 nmol/L (ref 0.00–0.24)

## 2023-10-11 LAB — THYROID PANEL WITH TSH
Free Thyroxine Index: 2.1 (ref 1.2–4.9)
T3 Uptake Ratio: 27 % (ref 24–39)
T4, Total: 7.9 ug/dL (ref 4.5–12.0)
TSH: 2.24 u[IU]/mL (ref 0.450–4.500)

## 2023-10-11 LAB — HGB A1C W/O EAG: Hgb A1c MFr Bld: 4.7 % — ABNORMAL LOW (ref 4.8–5.6)

## 2023-10-11 LAB — LYME DISEASE SEROLOGY W/REFLEX: Lyme Total Antibody EIA: NEGATIVE

## 2023-10-11 LAB — COPPER, SERUM: Copper: 80 ug/dL (ref 63–121)

## 2023-10-11 LAB — CK: Total CK: 111 U/L (ref 49–439)

## 2023-10-11 LAB — FOLATE: Folate: >20 ng/mL (ref >3.0)

## 2023-10-11 LAB — VITAMIN B12: Vitamin B-12: 549 pg/mL (ref 232–1245)

## 2023-10-11 LAB — RPR: RPR Ser Ql: NONREACTIVE

## 2023-10-12 ENCOUNTER — Encounter: Payer: Self-pay | Admitting: Neurology

## 2023-10-14 ENCOUNTER — Ambulatory Visit
Admission: RE | Admit: 2023-10-14 | Discharge: 2023-10-14 | Disposition: A | Discharge status: Home / Self Care | Source: Ambulatory Visit | Attending: Neurology | Admitting: Neurology

## 2023-10-14 ENCOUNTER — Ambulatory Visit: Admitting: Neurology

## 2023-10-14 DIAGNOSIS — R471 Dysarthria and anarthria: Secondary | ICD-10-CM

## 2023-10-14 DIAGNOSIS — R270 Ataxia, unspecified: Secondary | ICD-10-CM

## 2023-10-14 DIAGNOSIS — R269 Unspecified abnormalities of gait and mobility: Secondary | ICD-10-CM

## 2023-10-18 ENCOUNTER — Ambulatory Visit: Payer: Self-pay | Admitting: Neurology

## 2023-10-26 LAB — HUNTINGTON DISEASE REPEAT EXP

## 2023-10-26 LAB — CERULOPLASMIN: Ceruloplasmin: 18.5 mg/dL (ref 16.0–31.0)

## 2023-11-17 ENCOUNTER — Encounter (HOSPITAL_COMMUNITY): Payer: Self-pay | Admitting: Emergency Medicine

## 2023-11-17 ENCOUNTER — Emergency Department (HOSPITAL_COMMUNITY)
Admission: EM | Admit: 2023-11-17 | Discharge: 2023-11-17 | Discharge status: Against Medical Advice | Attending: Emergency Medicine | Admitting: Emergency Medicine

## 2023-11-17 ENCOUNTER — Other Ambulatory Visit: Payer: Self-pay

## 2023-11-17 DIAGNOSIS — R202 Paresthesia of skin: Secondary | ICD-10-CM | POA: Diagnosis present

## 2023-11-17 DIAGNOSIS — Z5321 Procedure and treatment not carried out due to patient leaving prior to being seen by health care provider: Secondary | ICD-10-CM | POA: Insufficient documentation

## 2023-11-17 NOTE — ED Notes (Signed)
 Pt reports that his panic attack is resolved and he no longer wishes to be seen. Pt calm and reasonable. Speech is calm and clear.

## 2023-11-17 NOTE — ED Triage Notes (Addendum)
 Pt came in stating he feels like he may be having a panic attack. Pt c/o tingling in right hand up arm to elbow that started earlier today. States he feels like he climbed a mountain with soreness all over and weakness.  States he is having a lot of medical problems with neuro apt's coming up for ataxia. Pt a/o. Pt slightly pale. Hx of spinal surgery. Pt better after consoling and advising to perform slow deep breathing.

## 2023-11-21 ENCOUNTER — Ambulatory Visit: Admitting: Diagnostic Neuroimaging

## 2023-12-15 ENCOUNTER — Telehealth: Payer: Self-pay | Admitting: Neurology

## 2023-12-15 NOTE — Telephone Encounter (Signed)
 Request to cx appointment, no longer needed

## 2023-12-21 ENCOUNTER — Encounter: Payer: Self-pay | Admitting: Neurology
